# Patient Record
Sex: Female | Born: 1950 | Race: White | Hispanic: No | Marital: Married | Smoking: Former smoker
Health system: Southern US, Community
[De-identification: ages and names within clinical notes are randomized; demographics above are authoritative.]

## PROBLEM LIST (undated history)

## (undated) DIAGNOSIS — T7840XA Allergy, unspecified, initial encounter: Secondary | ICD-10-CM

## (undated) DIAGNOSIS — M1990 Unspecified osteoarthritis, unspecified site: Secondary | ICD-10-CM

## (undated) DIAGNOSIS — K449 Diaphragmatic hernia without obstruction or gangrene: Secondary | ICD-10-CM

## (undated) DIAGNOSIS — I10 Essential (primary) hypertension: Secondary | ICD-10-CM

## (undated) DIAGNOSIS — E785 Hyperlipidemia, unspecified: Secondary | ICD-10-CM

## (undated) DIAGNOSIS — K219 Gastro-esophageal reflux disease without esophagitis: Secondary | ICD-10-CM

## (undated) HISTORY — PX: CHOLECYSTECTOMY: SHX55

## (undated) HISTORY — DX: Gastro-esophageal reflux disease without esophagitis: K21.9

## (undated) HISTORY — DX: Diaphragmatic hernia without obstruction or gangrene: K44.9

## (undated) HISTORY — DX: Allergy, unspecified, initial encounter: T78.40XA

## (undated) HISTORY — DX: Hyperlipidemia, unspecified: E78.5

## (undated) HISTORY — DX: Essential (primary) hypertension: I10

## (undated) HISTORY — DX: Unspecified osteoarthritis, unspecified site: M19.90

## (undated) HISTORY — PX: APPENDECTOMY: SHX54

---
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
## 1978-12-29 HISTORY — PX: KNEE SURGERY: SHX244

## 1982-12-29 HISTORY — PX: ABDOMINAL HYSTERECTOMY: SHX81

## 2007-03-23 ENCOUNTER — Emergency Department (HOSPITAL_COMMUNITY): Admission: EM | Admit: 2007-03-23 | Discharge: 2007-03-23 | Payer: Self-pay | Admitting: Emergency Medicine

## 2007-03-29 ENCOUNTER — Ambulatory Visit: Payer: Self-pay

## 2007-04-01 ENCOUNTER — Ambulatory Visit: Payer: Self-pay | Admitting: Cardiology

## 2007-04-09 ENCOUNTER — Ambulatory Visit: Payer: Self-pay

## 2007-04-19 ENCOUNTER — Ambulatory Visit: Payer: Self-pay | Admitting: General Surgery

## 2008-01-03 ENCOUNTER — Ambulatory Visit: Payer: Self-pay | Admitting: Family Medicine

## 2009-09-03 DIAGNOSIS — K219 Gastro-esophageal reflux disease without esophagitis: Secondary | ICD-10-CM | POA: Insufficient documentation

## 2009-09-03 DIAGNOSIS — E782 Mixed hyperlipidemia: Secondary | ICD-10-CM | POA: Insufficient documentation

## 2011-01-01 ENCOUNTER — Ambulatory Visit: Payer: Self-pay

## 2013-08-31 LAB — CBC
MCH: 29.6 pg (ref 26.0–34.0)
MCHC: 34.8 g/dL (ref 32.0–36.0)
Platelet: 294 10*3/uL (ref 150–440)
RBC: 5.33 10*6/uL — ABNORMAL HIGH (ref 3.80–5.20)
RDW: 13.9 % (ref 11.5–14.5)

## 2013-08-31 LAB — BASIC METABOLIC PANEL
BUN: 10 mg/dL (ref 7–18)
EGFR (African American): >60
Glucose: 90 mg/dL (ref 65–99)

## 2013-08-31 LAB — TROPONIN I: Troponin-I: <0.02 ng/mL

## 2013-09-01 ENCOUNTER — Observation Stay: Payer: Self-pay | Admitting: Internal Medicine

## 2013-09-01 LAB — TROPONIN I
Troponin-I: <0.02 ng/mL
Troponin-I: <0.02 ng/mL

## 2013-09-01 LAB — CK TOTAL AND CKMB (NOT AT ARMC)
CK, Total: 75 U/L (ref 21–215)
CK-MB: 0.5 ng/mL (ref 0.5–3.6)

## 2013-09-02 LAB — CBC WITH DIFFERENTIAL/PLATELET
Basophil #: 0 10*3/uL (ref 0.0–0.1)
Basophil %: 0.1 %
HGB: 15 g/dL (ref 12.0–16.0)
Lymphocyte %: 9.4 %
MCV: 85 fL (ref 80–100)
Monocyte %: 4.2 %
Neutrophil #: 11.6 10*3/uL — ABNORMAL HIGH (ref 1.4–6.5)
Platelet: 283 10*3/uL (ref 150–440)
RDW: 14.2 % (ref 11.5–14.5)

## 2013-09-02 LAB — BASIC METABOLIC PANEL
Anion Gap: 7 (ref 7–16)
Calcium, Total: 9.5 mg/dL (ref 8.5–10.1)
Chloride: 108 mmol/L — ABNORMAL HIGH (ref 98–107)
Creatinine: 0.86 mg/dL (ref 0.60–1.30)
EGFR (African American): >60
EGFR (Non-African Amer.): >60
Glucose: 115 mg/dL — ABNORMAL HIGH (ref 65–99)
Osmolality: 281 (ref 275–301)

## 2013-10-18 ENCOUNTER — Ambulatory Visit: Payer: Self-pay | Admitting: Family Medicine

## 2014-11-17 DIAGNOSIS — I2510 Atherosclerotic heart disease of native coronary artery without angina pectoris: Secondary | ICD-10-CM | POA: Insufficient documentation

## 2014-11-17 DIAGNOSIS — K219 Gastro-esophageal reflux disease without esophagitis: Secondary | ICD-10-CM | POA: Insufficient documentation

## 2014-11-17 DIAGNOSIS — R002 Palpitations: Secondary | ICD-10-CM | POA: Insufficient documentation

## 2015-04-20 NOTE — H&P (Signed)
PATIENT NAME:  Teresa Torres, PINHO MR#:  [id number] DATE OF BIRTH:  July 10, 1951  DATE OF ADMISSION:  09/01/2013  PRIMARY CARE PHYSICIAN:  Dr. Miguel Aschoff.   REFERRING PHYSICIAN:  Dr. Marta Antu.   CHIEF COMPLAINT:  Chest pain.   HISTORY OF PRESENT ILLNESS:  Ms. Hammerstrom is a 64 year old pleasant white female with past medical history of gastroesophageal reflux disease comes to the Emergency Department with complaints of chest pain that started around 4:30 in the morning.  The patient woke up with the chest pain, sharp in nature, radiating to the left jaw.  This was associated with shortness of breath.  The pain has been on and off, lasts from 30 seconds to two minutes.  No relieving factors.  Worsens with exertion.  Concerning this, came to the Emergency Department.  Work-up in the Emergency Department with EKG and cardiac enzymes were unremarkable.  The patient had a stress test done about six years back, left heart cath done 12 years back which was completely normal.  Denies having any nausea, vomiting, lightheadedness, diaphoresis.   PAST MEDICAL HISTORY:  Gastroesophageal reflux disease.   PAST SURGICAL HISTORY:  1.  Hysterectomy.  2.  Right knee surgery.  3.  Cholecystectomy.  4.  Appendectomy.  5.  Tonsillectomy.   SOCIAL HISTORY:  Smoked 1 pack a day, quit about six years back.  Drinks alcohol socially.  Denies using any illicit drugs.  Works as a Art gallery manager.   FAMILY HISTORY:  Heart problems in the family.   REVIEW OF SYSTEMS:  CONSTITUTIONAL:  Denies any generalized weakness.  EYES:  No change in vision.  EARS, NOSE, THROAT:  No change in hearing.  RESPIRATORY:  No cough, shortness of breath.  CARDIOVASCULAR:  Has chest pain.  GASTROINTESTINAL:  No nausea, vomiting, abdominal pain.  GENITOURINARY:  No dysuria or hematuria.  ENDOCRINE:  No polyuria or polydipsia.  HEMATOLOGY:  No easy bruising or bleeding. SKIN:  No rash or lesions.  MUSCULOSKELETAL:  No joint pains   NEUROLOGIC:  No weakness or numbness in any part of the body.   PHYSICAL EXAMINATION: GENERAL:  This is a well-built, well-nourished, age-appropriate female lying down in the bed, not in distress.  VITAL SIGNS:  Temperature 97.6, pulse 65, blood pressure 160/81, respiratory rate of 18, oxygen saturation 99% on room air.  HEENT:  Head normocephalic, atraumatic.  Eyes, no scleral icterus.  Conjunctivae normal.  Pupils equal and react to light.  Mucous membranes moist.  NECK:  Supple.  No lymphadenopathy.  No JVD.  No carotid bruit.  No thyromegaly.  CHEST:  Has no focal tenderness.  LUNGS:  Bilateral clear to auscultation.  HEART:  S1, S2, regular.  No murmurs are heard.  No pedal edema.  Pulses 2+.  ABDOMEN:  Bowel sounds plus.  Soft, nontender, nondistended.  No hepatosplenomegaly.  SKIN:  No rash or lesions.  MUSCULOSKELETAL:  Good range of motion all the extremities.  NEUROLOGIC:  The patient is alert, oriented to place, person and time.  Cranial nerves II through XII intact.  Motor 5 by 5 in upper and lower extremities.   LABORATORY DATA:  CMP is completely within normal limits.  Cardiac enzymes, troponin less than 0.02.  D-dimer is 0.47.  CBC, WBC of 9.6, hemoglobin 15.8, platelet count of 294.  EKG 12-lead, normal sinus rhythm with no ST-T wave abnormalities.    Chest x-ray, no acute cardiopulmonary disease.   ASSESSMENT AND PLAN:  Ms. Lanum is a 64 year old female who  comes to the Emergency Department with complaints of chest pain.  1.  Chest pain.  This seems to be atypical pain, however considering the patient's history of hypertension, age, previous history of smoking and strong family history of coronary artery disease in a young age in her father and a brother in the 41s, admit the patient to the telemetry unit.  Continue to cycle cardiac enzymes x 3.  If negative, we will get a dobutamine nuclear test.  2.  Hypertension, moderately-controlled.  3.  Keep the patient on DVT  prophylaxis with Lovenox.   TIME SPENT:  40 minutes.    ____________________________ Monica Becton, MD pv:ea D: 09/01/2013 04:13:03 ET T: 09/01/2013 04:55:19 ET JOB#: [id number]  cc: Monica Becton, MD, <Dictator> Richard L. Rosanna Randy, MD Grier Mitts Rayli Wiederhold MD ELECTRONICALLY SIGNED 09/04/2013 20:27

## 2015-04-20 NOTE — Consult Note (Signed)
PATIENT NAME:  Teresa Torres, Teresa Torres MR#:  [id number] DATE OF BIRTH:  1951/06/15  DATE OF CONSULTATION:  09/01/2013  REFERRING PHYSICIAN: Dr. Darvin Neighbours   CONSULTING PHYSICIAN:  Corey Skains, MD  REASON FOR CONSULTATION: Unstable angina with hyperlipidemia and history of tobacco abuse.   CHIEF COMPLAINT: "I have chest pain."   HISTORY OF PRESENT ILLNESS: This is a 64 year old female with known significant tobacco use, family history of cardiovascular disease, who has had new onset substernal chest discomfort radiating into her back, left upper chest as well as left neck lasing for several hours, waxing and waxing off and on for the last 24 hours,  now completely relieved, but worsened with activity of going to the bathroom. The patient has weakness and shortness of breath with activity earlier in the week. The patient has had a normal troponin and EKG shows normal sinus rhythm. Additionally, the patient has had a stress test, which shows normal myocardial perfusion with no evidence of ischemia. The symptoms, although appear to be significantly progressive and still occurring with activity to the bathroom and beyond, are consistent with further unstable angina despite no myocardial infarction. She does have hyperlipidemia, not treated with any medications at this time.   REVIEW OF SYSTEMS: The remainder review of systems negative for vision change, ringing in the ears, hearing loss, cough, congestion, heartburn, nausea, vomiting, diarrhea, bloody stools, stomach pain, extremity pain, leg weakness, cramping of the buttocks, known blood clots, headaches, blackouts, dizzy spells, nosebleeds, congestion, trouble swallowing, frequent urination, urination at night, muscle weakness, numbness, anxiety, depression, skin lesions, or skin rashes.   PAST MEDICAL HISTORY: Hyperlipidemia.   FAMILY HISTORY: Multiple family members on the mother's side with cardiovascular disease and early death.   SOCIAL HISTORY: Remote  tobacco use. Denying alcohol and tobacco use at this time.   ALLERGIES: As listed.   MEDICATIONS: As listed.   PHYSICAL EXAMINATION:  VITAL SIGNS: Blood pressure 136/68 bilaterally, heart rate 72 upright, reclining, and regular.  GENERAL: She is a well appearing female in no acute distress.  HEENT: No icterus, thyromegaly, ulcers, hemorrhage, or xanthelasma.  CARDIOVASCULAR: Regular rate and rhythm with normal S1 and S2 without murmur, gallop, or rub. PMI is normal size and placement. Carotid upstroke normal without bruit. Jugular venous pressure is normal.  LUNGS: Lungs are clear to auscultation with normal respirations.  ABDOMEN: Soft, nontender, without hepatosplenomegaly or masses. Abdominal aorta is normal size without bruit.  EXTREMITIES: 2+ bilateral pulses in dorsal, pedal, radial and femoral arteries without lower extremity edema, cyanosis, clubbing, or ulcers.  NEUROLOGIC: She is oriented to time, place, and person with normal mood and affect.   ASSESSMENT: A 64 year old female with hyperlipidemia, family history of cardiovascular disease, remote tobacco use with unstable angina with no current evidence of myocardial infarction, needing further evaluation and treatment options.   RECOMMENDATIONS:  1.  Continue nitroglycerin, aspirin for further treatment of chest discomfort.  2.  Proceed to cardiac catheterization to assess coronary anatomy and further treatment thereof as necessary. The patient understands the risks and benefits of cardiac catheterization. These include the possibility of death, stroke, heart attack, infection, bleeding, or blood clot. She is at low risk for conscious sedation.  ____________________________ Corey Skains, MD bjk:aw D: 09/01/2013 13:11:18 ET T: 09/01/2013 13:34:35 ET JOB#: [id number]  cc: Corey Skains, MD, <Dictator> Corey Skains MD ELECTRONICALLY SIGNED 09/07/2013 14:26

## 2015-04-20 NOTE — Discharge Summary (Signed)
PATIENT NAME:  Teresa Torres, Teresa Torres MR#:  [id number] DATE OF BIRTH:  [birth year]  DATE OF ADMISSION:  09/01/2013 DATE OF DISCHARGE:  09/02/2013  DISCHARGE DIAGNOSIS: Chest pain likely secondary from gastroesophageal reflux disease.   CONSULTANTS: Dr. Nehemiah Massed.   PROCEDURES: Cardiac catheterization which showed 1 vessel disease with no stent placed.   ADMITTING HISTORY AND PHYSICAL: Please see detailed H and P dictated by Dr. Lunette Stands previously. In brief, this is a 64 year old female patient who presented to the hospital complaining of chest pain, admitted to the hospitalist service.   HOSPITAL COURSE: For the chest pain, the patient had cardiac enzymes done which were normal. Also had a stress test which did not show any reversible ischemia, but secondary to her atypical symptoms with exertional chest pain the patient had a cardiac cath done which showed 1 vessel disease which did not need any stent. The patient also had a CT scan of the chest for pulmonary embolism which showed no pulmonary edema, infiltrate or PE. The patient is being discharged home to follow up with Dr. Nehemiah Massed of cardiology for her chest pain. Also, her PPI has been increased from once a day to twice a day to see if she responds to this change for her chest pain.   Prior to discharge, the patient did not have any chest pain or chest tenderness on exam. S1 and S2 without any murmurs on auscultation.   DISCHARGE MEDICATIONS: Include:  1.  Dexilant 60 mg oral 2 times a day.  2.  Alfalfa 1 tablet oral 1 to 2 times a day for allergies.  3.  Lipitor 10 mg oral once a day.  4.  Aspirin 81 mg oral once a day.   DISCHARGE INSTRUCTIONS: Low-fat, low-cholesterol diet. Activity as tolerated. Follow up with primary care physician and Dr. Nehemiah Massed in 1 to 2 weeks.   TIME SPENT: On day of discharge in discharge activity was 45 minutes.   ____________________________ Leia Alf Batina Dougan, MD srs:sb D: 09/06/2013 14:34:42 ET T: 09/06/2013  14:41:21 ET JOB#: [id number]  cc: Alveta Heimlich R. Jaicey Sweaney, MD, <Dictator> Neita Carp MD ELECTRONICALLY SIGNED 09/10/2013 19:28

## 2015-07-30 DIAGNOSIS — I10 Essential (primary) hypertension: Secondary | ICD-10-CM | POA: Insufficient documentation

## 2015-08-13 ENCOUNTER — Ambulatory Visit
Admission: RE | Admit: 2015-08-13 | Discharge: 2015-08-13 | Disposition: A | Discharge status: Home / Self Care | Payer: BLUE CROSS/BLUE SHIELD | Source: Ambulatory Visit | Attending: Family Medicine | Admitting: Family Medicine

## 2015-08-13 ENCOUNTER — Encounter: Payer: Self-pay | Admitting: Family Medicine

## 2015-08-13 ENCOUNTER — Ambulatory Visit (INDEPENDENT_AMBULATORY_CARE_PROVIDER_SITE_OTHER): Payer: BLUE CROSS/BLUE SHIELD | Admitting: Family Medicine

## 2015-08-13 VITALS — BP 120/80 | HR 72 | Temp 98.1°F | Resp 16 | Wt 175.0 lb

## 2015-08-13 DIAGNOSIS — M25561 Pain in right knee: Secondary | ICD-10-CM | POA: Diagnosis not present

## 2015-08-13 DIAGNOSIS — M179 Osteoarthritis of knee, unspecified: Secondary | ICD-10-CM | POA: Insufficient documentation

## 2015-08-13 MED ORDER — NAPROXEN 500 MG PO TBEC: 500.0000 mg | DELAYED_RELEASE_TABLET | Freq: Two times a day (BID) | ORAL | Status: DC

## 2015-08-13 NOTE — Progress Notes (Signed)
Patient: Teresa Torres Female    DOB: 19-Jul-1951   64 y.o.   MRN: [id number] Visit Date: 08/13/2015  Today's Provider: Vernie Murders, PA   Chief Complaint  Patient presents with  . Knee Pain    Right knee   Subjective:    Knee Pain  Incident onset: It has been bothering her for about a year. Last month has gotten worst. There was no injury mechanism. The pain is present in the right knee. The quality of the pain is described as burning and aching (burns inside like pinching pain and it aches at night). The pain is at a severity of 3/10 (when tries to walk is an 8). The pain is moderate. Pain course: Depends on what patient is doing. Associated symptoms include a loss of motion and tingling (more burning). Pertinent negatives include no inability to bear weight, loss of sensation, muscle weakness or numbness. The symptoms are aggravated by movement. Treatments tried: Aleve. The treatment provided no relief.   Past Surgical History  Procedure Laterality Date  . Tonsillectomy  1961  . Breast biopsy  1972  . Cholecystectomy  2007-2008  . Knee surgery  1980  . Abdominal hysterectomy  1984    Partial   History reviewed. No pertinent family history. Past Medical History  Diagnosis Date  . Hyperlipidemia   . Hiatal hernia   . Arthritis     of spine     Allergies  Allergen Reactions  . Compazine [Prochlorperazine Edisylate] Anaphylaxis  . Aspirin Nausea And Vomiting   Previous Medications   ATORVASTATIN (LIPITOR) 10 MG TABLET    Take by mouth.   DEXLANSOPRAZOLE (DEXILANT) 60 MG CAPSULE    Take by mouth.   Review of Systems  Constitutional: Negative.   HENT: Negative.   Eyes: Negative.   Respiratory: Negative.   Cardiovascular: Negative.  Negative for chest pain and palpitations.  Gastrointestinal: Negative.   Endocrine: Negative.   Genitourinary: Negative.   Musculoskeletal: Negative.        Knee is very Stiff and tight  Skin: Negative.   Allergic/Immunologic:  Negative.   Neurological: Positive for tingling (more burning). Negative for weakness and numbness.  Hematological: Negative.   Psychiatric/Behavioral: Negative.    Social History  Substance Use Topics  . Smoking status: Former Research scientist (life sciences)  . Smokeless tobacco: Never Used     Comment: smoked cigarettes about 1 pack per day; quit in 2007, smoked fro 20 years  . Alcohol Use: Yes     Comment: Drinks wine; Occasional alcohol use   Objective:   BP 120/80 mmHg  Pulse 72  Temp(Src) 98.1 F (36.7 C) (Oral)  Resp 16  Wt 175 lb (79.379 kg)  Physical Exam  Constitutional: She is oriented to person, place, and time. She appears well-developed and well-nourished. No distress.  HENT:  Head: Normocephalic and atraumatic.  Right Ear: Hearing normal.  Left Ear: Hearing normal.  Nose: Nose normal.  Eyes: Conjunctivae and lids are normal. Right eye exhibits no discharge. Left eye exhibits no discharge. No scleral icterus.  Pulmonary/Chest: Effort normal. No respiratory distress.  Musculoskeletal:  Tender medial side of right knee with generalized swelling. About 10-15 degrees of extension loss. Increased pain when she first stands. Better after walking a few steps.   Neurological: She is alert and oriented to person, place, and time.  Skin: Skin is intact. No lesion and no rash noted.  Psychiatric: She has a normal mood and affect. Her speech  is normal and behavior is normal. Thought content normal.      Assessment & Plan:     1. Right knee pain Worsening of right knee pain over the past month without known injury. History of surgery for subluxing patella in 1980. Swelling and medial tenderness with decrease in full extension of the right knee. Will get x-ray evaluation and treat with EC-Naprosyn BID. Recheck pending reports. - DG Knee Complete 4 Views Right - naproxen (EC NAPROSYN) 500 MG EC tablet; Take 1 tablet (500 mg total) by mouth 2 (two) times daily with a meal.  Dispense: 60 tablet;  Refill: 2       Vernie Murders, PA  [facility]

## 2015-08-14 ENCOUNTER — Telehealth: Payer: Self-pay

## 2015-08-14 NOTE — Telephone Encounter (Signed)
Patient advised as directed below.  Thanks,  -Joseline 

## 2015-08-14 NOTE — Telephone Encounter (Signed)
-----   Message from Margo Common, [place] sent at 08/13/2015  6:41 PM EDT ----- X-ray confirms osteoarthritis of the knee. If no better with use of the EC-Naprosyn and rest for 7-10 days, will need referral to orthopedist to consider cortisone injection.

## 2015-10-19 ENCOUNTER — Other Ambulatory Visit: Payer: Self-pay | Admitting: Family Medicine

## 2015-10-19 ENCOUNTER — Ambulatory Visit (INDEPENDENT_AMBULATORY_CARE_PROVIDER_SITE_OTHER): Payer: BLUE CROSS/BLUE SHIELD | Admitting: Family Medicine

## 2015-10-19 ENCOUNTER — Encounter: Payer: Self-pay | Admitting: Family Medicine

## 2015-10-19 VITALS — BP 126/72 | HR 64 | Temp 98.3°F | Resp 16 | Wt 179.4 lb

## 2015-10-19 DIAGNOSIS — N3091 Cystitis, unspecified with hematuria: Secondary | ICD-10-CM

## 2015-10-19 LAB — POCT URINALYSIS DIPSTICK
Bilirubin, UA: NEGATIVE
Glucose, UA: NEGATIVE
Ketones, UA: NEGATIVE
Nitrite, UA: NEGATIVE
PROTEIN UA: NEGATIVE
Spec Grav, UA: <=1.005
UROBILINOGEN UA: 0.2
pH, UA: 5

## 2015-10-19 MED ORDER — NITROFURANTOIN MONOHYD MACRO 100 MG PO CAPS: 100.0000 mg | ORAL_CAPSULE | Freq: Two times a day (BID) | ORAL | Status: DC

## 2015-10-19 NOTE — Progress Notes (Signed)
Subjective:     Patient ID: Teresa Torres, female   DOB: January 11, 1951, 64 y.o.   MRN: [id number]  HPI  Chief Complaint  Patient presents with  . Urinary Frequency    Patient comes in office today with concerns or urinary frequency an pelvic pressure since this morning  States she get a bladder infection annually. No hx of kidney stones.   Review of Systems  Constitutional: Negative for fever and chills.       Objective:   Physical Exam  Constitutional: She appears well-developed and well-nourished. No distress.  Genitourinary:  No cva tenderness       Assessment:    1. Cystitis with hematuria - POCT urinalysis dipstick - Urine culture - nitrofurantoin, macrocrystal-monohydrate, (MACROBID) 100 MG capsule; Take 1 capsule (100 mg total) by mouth 2 (two) times daily.  Dispense: 14 capsule; Refill: 0    Plan:    Further f/u pending urine culture.

## 2015-10-19 NOTE — Patient Instructions (Signed)
We will call you with the urine culture result. 

## 2015-10-22 LAB — URINE CULTURE

## 2015-10-30 DIAGNOSIS — R0681 Apnea, not elsewhere classified: Secondary | ICD-10-CM | POA: Insufficient documentation

## 2016-01-21 ENCOUNTER — Encounter: Payer: Self-pay | Admitting: Family Medicine

## 2016-01-21 ENCOUNTER — Ambulatory Visit (INDEPENDENT_AMBULATORY_CARE_PROVIDER_SITE_OTHER): Payer: BLUE CROSS/BLUE SHIELD | Admitting: Family Medicine

## 2016-01-21 VITALS — BP 124/74 | HR 64 | Temp 98.3°F | Resp 16 | Wt 173.8 lb

## 2016-01-21 DIAGNOSIS — M47819 Spondylosis without myelopathy or radiculopathy, site unspecified: Secondary | ICD-10-CM | POA: Insufficient documentation

## 2016-01-21 DIAGNOSIS — H9201 Otalgia, right ear: Secondary | ICD-10-CM

## 2016-01-21 NOTE — Patient Instructions (Signed)
Start Naproxen twice daily with food.

## 2016-01-21 NOTE — Progress Notes (Signed)
Subjective:     Patient ID: Teresa Torres, female   DOB: 1951-11-04, 65 y.o.   MRN: [id number]  HPI  Chief Complaint  Patient presents with  . Ear Pain    Patient comes in office today with complaints of right ear pain intermittent for more than 1 week. Patient reports on Saturday pain increased she describes it as a sharp/achy pain. Patient has tried taking otc Advil and Zyrtec with no relief.  States pain has abated but feels she is "hearing through a tunnel." Denies precedent cold or significant allergy symptoms. No hx of bruxism or gum chewing. States she saw her dentist one month ago with normal exam. Has hx of neck arthritis per a neurologist 20 years ago-no imaging.   Review of Systems     Objective:   Physical Exam  Constitutional: She appears well-developed and well-nourished. No distress.  HENT:  No tenderness on percussion of her right posterior teeth. No TMJ tenderness. Left TM is normal; R TM partially obscured by cerumen but no inflammation noted or tragal tenderness. No rash or facial weakness noted.  Musculoskeletal:  Cervical ROM is normal.       Assessment:    1. Otalgia of right ear: ? Eustachian tube dysfunction ? Cervical arthritis    Plan:    Start Naproxen; Consider fluticasone if not improving.

## 2016-02-15 ENCOUNTER — Ambulatory Visit (INDEPENDENT_AMBULATORY_CARE_PROVIDER_SITE_OTHER): Payer: BLUE CROSS/BLUE SHIELD | Admitting: Family Medicine

## 2016-02-15 ENCOUNTER — Encounter: Payer: Self-pay | Admitting: Family Medicine

## 2016-02-15 VITALS — BP 130/92 | HR 76 | Temp 98.0°F | Resp 16 | Ht 63.25 in | Wt 174.4 lb

## 2016-02-15 DIAGNOSIS — I10 Essential (primary) hypertension: Secondary | ICD-10-CM

## 2016-02-15 DIAGNOSIS — E782 Mixed hyperlipidemia: Secondary | ICD-10-CM | POA: Diagnosis not present

## 2016-02-15 DIAGNOSIS — Z0000 Encounter for general adult medical examination without abnormal findings: Secondary | ICD-10-CM

## 2016-02-15 DIAGNOSIS — K219 Gastro-esophageal reflux disease without esophagitis: Secondary | ICD-10-CM

## 2016-02-15 DIAGNOSIS — Z124 Encounter for screening for malignant neoplasm of cervix: Secondary | ICD-10-CM

## 2016-02-15 LAB — POCT URINALYSIS DIPSTICK
Bilirubin, UA: NEGATIVE
GLUCOSE UA: NEGATIVE
Ketones, UA: NEGATIVE
Leukocytes, UA: NEGATIVE
Nitrite, UA: NEGATIVE
PH UA: 6
Protein, UA: NEGATIVE
RBC UA: NEGATIVE
UROBILINOGEN UA: 0.2

## 2016-02-15 NOTE — Progress Notes (Signed)
Patient ID: Teresa Torres, female   DOB: [birth year], 65 y.o.   MRN: [id number]       Patient: Teresa Torres, Female    DOB: 1951-07-16, 65 y.o.   MRN: [id number] Visit Date: 02/15/2016  Today's Provider: Vernie Murders, PA   Chief Complaint  Patient presents with  . Annual Exam   Subjective:    Annual physical exam Teresa Torres is a 65 y.o. female who presents today for health maintenance and complete physical. She feels well. She reports exercising 2 times per week. She reports she is sleeping well (average 6-8 hours per night).  -----------------------------------------------------------------   Review of Systems  Constitutional: Negative.   HENT: Positive for ear pain and sneezing.   Eyes: Positive for itching.  Respiratory: Negative.   Cardiovascular: Negative.   Gastrointestinal: Negative.   Endocrine: Negative.   Genitourinary: Negative.   Musculoskeletal: Positive for arthralgias.  Skin: Negative.   Allergic/Immunologic: Positive for food allergies.  Neurological: Negative.   Hematological: Negative.   Psychiatric/Behavioral: Negative.     Social History      She  reports that she has quit smoking. She has never used smokeless tobacco. She reports that she drinks alcohol. She reports that she does not use illicit drugs.       Social History   Social History  . Marital Status: Married    Spouse Name: N/A  . Number of Children: N/A  . Years of Education: N/A   Social History Main Topics  . Smoking status: Former Research scientist (life sciences)  . Smokeless tobacco: Never Used     Comment: smoked cigarettes about 1 pack per day; quit in 2007, smoked fro 20 years  . Alcohol Use: Yes     Comment: Drinks wine; Occasional alcohol use  . Drug Use: No  . Sexual Activity: Not Asked   Other Topics Concern  . None   Social History Narrative    Past Medical History  Diagnosis Date  . Hyperlipidemia   . Hiatal hernia   . Arthritis     of spine     Patient Active Problem List   Diagnosis Date Noted  . Arthritis of spine 01/21/2016  . Breathlessness on exertion 10/30/2015  . Benign essential HTN 07/30/2015  . Arteriosclerosis of coronary artery 11/17/2014  . Acid reflux 11/17/2014  . Awareness of heartbeats 11/17/2014  . HYPERLIPIDEMIA, MIXED 09/03/2009  . ACID REFLUX DISEASE 09/03/2009    Past Surgical History  Procedure Laterality Date  . Tonsillectomy  1961  . Breast biopsy  1972  . Cholecystectomy  2007-2008  . Knee surgery  1980  . Abdominal hysterectomy  1984    Partial    Family History        Family Status  Relation Status Death Age  . Mother Deceased 29    Heart Disease  . Father Deceased 83    Died from a motor vehicle  . Brother Deceased     died from a motor vehicle accident  . Son Alive     strokes      Allergies  Allergen Reactions  . Compazine [Prochlorperazine Edisylate] Anaphylaxis  . Aspirin Nausea And Vomiting    Previous Medications   ATORVASTATIN (LIPITOR) 10 MG TABLET    Take by mouth.   DEXLANSOPRAZOLE (DEXILANT) 60 MG CAPSULE    Take by mouth.   NAPROXEN (EC NAPROSYN) 500 MG EC TABLET    Take 1 tablet (500 mg total) by mouth 2 (two) times daily with a  meal.    Patient Care Team: Margo Common, PA as PCP - General (Physician Assistant)     Objective:   Vitals: BP 130/92 mmHg  Pulse 76  Temp(Src) 98 F (36.7 C) (Oral)  Resp 16  Ht 5' 3.25" (1.607 m)  Wt 174 lb 6.4 oz (79.107 kg)  BMI 30.63 kg/m2  BP Readings from Last 3 Encounters:  02/15/16 130/92  01/21/16 124/74  02/26/15 140/76    Physical Exam  Constitutional: She is oriented to person, place, and time. She appears well-developed and well-nourished.  HENT:  Head: Normocephalic.  Right Ear: External ear normal.  Left Ear: External ear normal.  Nose: Nose normal.  Mouth/Throat: Oropharynx is clear and moist.  Eyes: Conjunctivae and EOM are normal. Pupils are equal, round, and reactive to light.  Neck: Normal range of motion. Neck supple.  No JVD present. No thyromegaly present.  Cardiovascular: Normal rate, regular rhythm, normal heart sounds and intact distal pulses.   Pulmonary/Chest: Effort normal and breath sounds normal.  Abdominal: Soft. Bowel sounds are normal.  Genitourinary: No vaginal discharge found.  Status post partial hysterectomy in 1984 due to DUB from endometrial polyps. No breast masses on palpation. 2-3+ cystocele noted on bimanual exam.  Musculoskeletal:  Fair ROM in spine. Right knee puffy and approximate 10 degree loss of full extension with some crepitus. Good 2+ pulses bilaterally in all extremities.  Lymphadenopathy:    She has no cervical adenopathy.  Neurological: She is alert and oriented to person, place, and time. She has normal reflexes.  Skin: No rash noted.  Psychiatric: She has a normal mood and affect. Her behavior is normal. Thought content normal.    Depression Screen PHQ 2/9 Scores 02/15/2016  PHQ - 2 Score 0    Assessment & Plan:     Routine Health Maintenance and Physical Exam  Exercise Activities and Dietary recommendations Goals    Recommend continuing regular exercise 2-3 days a week. Walk a half-mile twice a week. Continue low fat diet.      Immunization History  Administered Date(s) Administered  . Tdap 10/17/2013    Health Maintenance  Topic Date Due  . Hepatitis C Screening  11/28/1951  . HIV Screening  08/29/1968  . PAP SMEAR  08/29/1974  . ZOSTAVAX  08/29/2013  . INFLUENZA VACCINE  07/30/2015  . MAMMOGRAM  10/19/2015  . TETANUS/TDAP  10/18/2023  . COLONOSCOPY  12/06/2023      Discussed health benefits of physical activity, and encouraged her to engage in regular exercise appropriate for her age and condition.    -------------------------------------------------------------------- 1. Annual physical exam General exam normal. Will schedule screening mammogram and BMD tests. Tdap up to date and last colonoscopy in 2014. Declines Zostavax until she  returns from trip to [place] next month. Obtained specimen for PAP. - POCT urinalysis dipstick - DG Bone Density - MM Digital Screening  2. Gastroesophageal reflux disease, esophagitis presence not specified Well controlled with use of Dexilant. Continues follow up with gastroenterologist.  3. Benign essential HTN Borderline hypertension with diastolic pressure 92. Limit salt and caffeine in diet. Decrease use of Naproxen pending lab reports. - CBC with Differential/Platelet; Future  4. HYPERLIPIDEMIA, MIXED Tolerating Atorvastatin 10 mg qd and low fat diet. Should lose some weight and continue regular exercise. Recheck labs. - Lipid panel; Future - TSH; Future - Comprehensive Metabolic Panel (CMET); Future  5. Cervical cancer screening History of partial hysterectomy in 1984 for DUB with endometrial polyps (benign). No adnexal masses  with cystocele on pelvic exam. Obtained specimen for PAP. - Pap IG (Image Guided)

## 2016-02-18 LAB — PAP IG (IMAGE GUIDED): PAP Smear Comment: 0

## 2016-02-19 ENCOUNTER — Telehealth: Payer: Self-pay

## 2016-02-19 NOTE — Telephone Encounter (Signed)
-----   Message from Margo Common, [place] sent at 02/18/2016  6:17 PM EST ----- Normal PAP without any signs of cancerous cells.

## 2016-02-19 NOTE — Telephone Encounter (Signed)
LMTCB

## 2016-02-20 NOTE — Telephone Encounter (Signed)
Patient advised.

## 2016-02-22 ENCOUNTER — Telehealth: Payer: Self-pay | Admitting: Family Medicine

## 2016-02-22 MED ORDER — ATORVASTATIN CALCIUM 10 MG PO TABS: 10.0000 mg | ORAL_TABLET | Freq: Every day | ORAL | Status: DC

## 2016-02-22 NOTE — Telephone Encounter (Signed)
Pt is requesting refill on atorvastatin (LIPITOR) 10 MG tablet. Sent into [facility]

## 2016-02-22 NOTE — Telephone Encounter (Signed)
Sent refill of Atorvastatin but still need to get labs done. Future order is in chart.

## 2016-02-22 NOTE — Telephone Encounter (Signed)
Please review-aa 

## 2016-02-25 NOTE — Telephone Encounter (Signed)
LMTCB

## 2016-02-26 ENCOUNTER — Other Ambulatory Visit: Payer: Self-pay

## 2016-02-26 DIAGNOSIS — E782 Mixed hyperlipidemia: Secondary | ICD-10-CM

## 2016-02-26 DIAGNOSIS — I10 Essential (primary) hypertension: Secondary | ICD-10-CM

## 2016-02-26 NOTE — Telephone Encounter (Signed)
Opened in error

## 2016-02-26 NOTE — Telephone Encounter (Signed)
Pt returned call. I advised pt of info below. Pt stated she plans to get her labs done this week. Thanks TNP

## 2016-02-28 ENCOUNTER — Telehealth: Payer: Self-pay

## 2016-02-28 LAB — LIPID PANEL
CHOL/HDL RATIO: 4 ratio (ref 0.0–4.4)
CHOLESTEROL TOTAL: 232 mg/dL — AB (ref 100–199)
HDL: 58 mg/dL (ref >39)
LDL Calculated: 141 mg/dL — ABNORMAL HIGH (ref 0–99)
Triglycerides: 166 mg/dL — ABNORMAL HIGH (ref 0–149)
VLDL Cholesterol Cal: 33 mg/dL (ref 5–40)

## 2016-02-28 LAB — CBC WITH DIFFERENTIAL/PLATELET
BASOS: 1 %
Basophils Absolute: 0 10*3/uL (ref 0.0–0.2)
EOS (ABSOLUTE): 0.2 10*3/uL (ref 0.0–0.4)
Eos: 2 %
HEMOGLOBIN: 14.1 g/dL (ref 11.1–15.9)
Hematocrit: 42.2 % (ref 34.0–46.6)
IMMATURE GRANS (ABS): 0 10*3/uL (ref 0.0–0.1)
Immature Granulocytes: 0 %
LYMPHS: 37 %
Lymphocytes Absolute: 2.5 10*3/uL (ref 0.7–3.1)
MCH: 29.1 pg (ref 26.6–33.0)
MCHC: 33.4 g/dL (ref 31.5–35.7)
MCV: 87 fL (ref 79–97)
MONOCYTES: 7 %
Monocytes Absolute: 0.5 10*3/uL (ref 0.1–0.9)
NEUTROS ABS: 3.6 10*3/uL (ref 1.4–7.0)
Neutrophils: 53 %
Platelets: 331 10*3/uL (ref 150–379)
RBC: 4.84 x10E6/uL (ref 3.77–5.28)
RDW: 13.3 % (ref 12.3–15.4)
WBC: 6.8 10*3/uL (ref 3.4–10.8)

## 2016-02-28 LAB — COMPREHENSIVE METABOLIC PANEL
A/G RATIO: 2.2 (ref 1.1–2.5)
ALBUMIN: 4.3 g/dL (ref 3.6–4.8)
ALT: 18 IU/L (ref 0–32)
AST: 16 IU/L (ref 0–40)
Alkaline Phosphatase: 62 IU/L (ref 39–117)
BILIRUBIN TOTAL: 0.3 mg/dL (ref 0.0–1.2)
BUN / CREAT RATIO: 17 (ref 11–26)
BUN: 16 mg/dL (ref 8–27)
CHLORIDE: 104 mmol/L (ref 96–106)
CO2: 26 mmol/L (ref 18–29)
Calcium: 9.9 mg/dL (ref 8.7–10.3)
Creatinine, Ser: 0.93 mg/dL (ref 0.57–1.00)
GFR calc non Af Amer: 66 mL/min/{1.73_m2} (ref >59)
GFR, EST AFRICAN AMERICAN: 76 mL/min/{1.73_m2} (ref >59)
Globulin, Total: 2 g/dL (ref 1.5–4.5)
Glucose: 97 mg/dL (ref 65–99)
POTASSIUM: 5.2 mmol/L (ref 3.5–5.2)
Sodium: 145 mmol/L — ABNORMAL HIGH (ref 134–144)
TOTAL PROTEIN: 6.3 g/dL (ref 6.0–8.5)

## 2016-02-28 LAB — TSH: TSH: 3.66 u[IU]/mL (ref 0.450–4.500)

## 2016-02-28 NOTE — Telephone Encounter (Signed)
Left message to call back  

## 2016-02-28 NOTE — Telephone Encounter (Signed)
-----   Message from Margo Common, [place] sent at 02/28/2016  8:47 AM EST ----- Total cholesterol, triglycerides and LDL still high. If she has been on the 10 mg qd of Atrovastatin, will need to increase to 20 mg qd. If not, need to get on regular dosing daily and get back on the low fat diet. Recheck progress in 3 months.

## 2016-03-03 ENCOUNTER — Ambulatory Visit
Admission: RE | Admit: 2016-03-03 | Discharge: 2016-03-03 | Disposition: A | Discharge status: Home / Self Care | Payer: BLUE CROSS/BLUE SHIELD | Source: Ambulatory Visit | Attending: Family Medicine | Admitting: Family Medicine

## 2016-03-03 DIAGNOSIS — Z1231 Encounter for screening mammogram for malignant neoplasm of breast: Secondary | ICD-10-CM | POA: Diagnosis not present

## 2016-03-03 DIAGNOSIS — M8580 Other specified disorders of bone density and structure, unspecified site: Secondary | ICD-10-CM | POA: Insufficient documentation

## 2016-03-03 DIAGNOSIS — Z13820 Encounter for screening for osteoporosis: Secondary | ICD-10-CM | POA: Insufficient documentation

## 2016-03-04 ENCOUNTER — Other Ambulatory Visit: Payer: Self-pay | Admitting: Family Medicine

## 2016-03-04 DIAGNOSIS — R928 Other abnormal and inconclusive findings on diagnostic imaging of breast: Secondary | ICD-10-CM

## 2016-03-04 NOTE — Telephone Encounter (Signed)
-----   Message from Margo Common, [place] sent at 03/03/2016  4:57 PM EST ----- Minimal fracture risk on bone density test. Recommend Calcium 1200-1800 mg and 1000 IU  Vitamin-D daily to prevent decrease in bone density. Recheck test in 2 years.

## 2016-03-04 NOTE — Telephone Encounter (Signed)
-----   Message from Margo Common, [place] sent at 03/04/2016 11:10 AM EST ----- Possible lesion in the left breast. Proceed with additional diagnostic mammogram and ultrasound as recommended by radiologist.

## 2016-03-04 NOTE — Telephone Encounter (Signed)
Left message to call back  

## 2016-03-13 ENCOUNTER — Other Ambulatory Visit: Payer: Self-pay

## 2016-03-13 DIAGNOSIS — R928 Other abnormal and inconclusive findings on diagnostic imaging of breast: Secondary | ICD-10-CM

## 2016-03-13 NOTE — Telephone Encounter (Signed)
LMTCB

## 2016-03-14 NOTE — Telephone Encounter (Signed)
LMTCB

## 2016-03-17 NOTE — Telephone Encounter (Signed)
Patient advised as directed below. Patient verbalized understanding and agrees with plan of care. Patient states she had not been taking Atorvastatin 10 mg for a couple months prior to lab work. Patient states she will take medication on a daily basis.

## 2016-03-21 ENCOUNTER — Telehealth: Payer: Self-pay | Admitting: Family Medicine

## 2016-03-21 NOTE — Telephone Encounter (Signed)
Ask patient if extra images on left breast lesion found on mammograms on 03-04-16 have been done? No reports in chart yet.

## 2016-03-24 ENCOUNTER — Telehealth: Payer: Self-pay

## 2016-03-24 ENCOUNTER — Ambulatory Visit
Admission: RE | Admit: 2016-03-24 | Discharge: 2016-03-24 | Disposition: A | Discharge status: Home / Self Care | Payer: BLUE CROSS/BLUE SHIELD | Source: Ambulatory Visit | Attending: Family Medicine | Admitting: Family Medicine

## 2016-03-24 DIAGNOSIS — R928 Other abnormal and inconclusive findings on diagnostic imaging of breast: Secondary | ICD-10-CM | POA: Insufficient documentation

## 2016-03-24 NOTE — Telephone Encounter (Signed)
Patient is getting extra images on breast today.

## 2016-03-24 NOTE — Telephone Encounter (Signed)
LMTCB

## 2016-03-24 NOTE — Telephone Encounter (Signed)
-----   Message from Margo Common, [place] sent at 03/24/2016  3:42 PM EDT ----- Mammogram and ultrasound of left breast mass is negative for malignancy. Probably a benign lesion. Radiologist recommended repeat films in 6 months to be certain this lesion does not change or become suspicious for cancer.

## 2016-04-01 NOTE — Telephone Encounter (Signed)
LMTCB

## 2016-04-02 NOTE — Telephone Encounter (Signed)
Patient advised as directed below. Patient states she had already received the report from other provider.

## 2016-04-02 NOTE — Telephone Encounter (Signed)
LMTCB

## 2016-04-02 NOTE — Telephone Encounter (Signed)
Pt is returning call.  CB#[phone] after 1:30/MW

## 2016-09-23 ENCOUNTER — Ambulatory Visit (INDEPENDENT_AMBULATORY_CARE_PROVIDER_SITE_OTHER): Payer: BLUE CROSS/BLUE SHIELD | Admitting: Family Medicine

## 2016-09-23 ENCOUNTER — Encounter: Payer: Self-pay | Admitting: Family Medicine

## 2016-09-23 VITALS — BP 136/88 | HR 82 | Temp 97.9°F | Resp 16 | Wt 183.2 lb

## 2016-09-23 DIAGNOSIS — J320 Chronic maxillary sinusitis: Secondary | ICD-10-CM | POA: Diagnosis not present

## 2016-09-23 MED ORDER — AMOXICILLIN-POT CLAVULANATE 875-125 MG PO TABS: 1.0000 | ORAL_TABLET | Freq: Two times a day (BID) | ORAL | 0 refills | Status: DC

## 2016-09-23 NOTE — Progress Notes (Signed)
Patient: Teresa Torres Female    DOB: 1951/07/12   65 y.o.   MRN: [id number] Visit Date: 09/23/2016  Today's Provider: Vernie Murders, PA   Chief Complaint  Patient presents with  . URI   Subjective:    URI   This is a new problem. The current episode started more than 1 month ago. The problem has been waxing and waning. There has been no fever. Associated symptoms include congestion, coughing, ear pain, headaches, rhinorrhea and sinus pain. She has tried antihistamine and decongestant for the symptoms. The treatment provided mild relief.  No significant relief from allergy medications, steroid nasal spray, herbal medications, etc. Xyzal causes significant drowsiness day and night when taken.  Past Medical History:  Diagnosis Date  . Arthritis    of spine  . Hiatal hernia   . Hyperlipidemia    Past Surgical History:  Procedure Laterality Date  . ABDOMINAL HYSTERECTOMY  1984   Partial  . BREAST BIOPSY Right 1972   EXCISIONAL - NEG  . CHOLECYSTECTOMY  2007-2008  . KNEE SURGERY  1980  . TONSILLECTOMY  1961   Family History  Problem Relation Age of Onset  . Breast cancer Paternal Grandmother    Allergies  Allergen Reactions  . Compazine [Prochlorperazine Edisylate] Anaphylaxis  . Aspirin Nausea And Vomiting     Previous Medications   ATORVASTATIN (LIPITOR) 10 MG TABLET    Take 1 tablet (10 mg total) by mouth daily.   DEXLANSOPRAZOLE (DEXILANT) 60 MG CAPSULE    Take by mouth.   NAPROXEN (EC NAPROSYN) 500 MG EC TABLET    Take 1 tablet (500 mg total) by mouth 2 (two) times daily with a meal.    Review of Systems  Constitutional: Negative.   HENT: Positive for congestion, ear pain and rhinorrhea.   Respiratory: Positive for cough.   Cardiovascular: Negative.   Neurological: Positive for headaches.    Social History  Substance Use Topics  . Smoking status: Former Research scientist (life sciences)  . Smokeless tobacco: Never Used     Comment: smoked cigarettes about 1 pack per day; quit in  2007, smoked fro 20 years  . Alcohol use Yes     Comment: Drinks wine; Occasional alcohol use   Objective:   BP 136/88 (BP Location: Right Arm, Patient Position: Sitting, Cuff Size: Normal)   Pulse 82   Temp 97.9 F (36.6 C) (Oral)   Resp 16   Wt 183 lb 3.2 oz (83.1 kg)   BMI 32.20 kg/m   Physical Exam  Constitutional: She is oriented to person, place, and time. She appears well-developed and well-nourished. No distress.  HENT:  Head: Normocephalic and atraumatic.  Right Ear: Hearing and external ear normal.  Left Ear: Hearing and external ear normal.  Nose: Nose normal.  Mouth/Throat: Oropharynx is clear and moist.  No transillumination of the right maxillary sinus and poor transillumination on the left. Normal in frontal sinuses.  Eyes: Conjunctivae and lids are normal. Right eye exhibits no discharge. Left eye exhibits no discharge. No scleral icterus.  Neck: Neck supple.  Cardiovascular: Normal rate and regular rhythm.   Pulmonary/Chest: Effort normal and breath sounds normal. No respiratory distress.  Abdominal: Soft. Bowel sounds are normal.  Musculoskeletal: Normal range of motion.  Neurological: She is alert and oriented to person, place, and time.  Skin: Skin is intact. No lesion and no rash noted.  Psychiatric: She has a normal mood and affect. Her speech is normal and behavior is normal. Thought content  normal.      Assessment & Plan:     1. Maxillary sinusitis, unspecified chronicity Recurrent several times in the past 4 months with congestion and sinus pressure. Some sneezing and rhinorrhea. Occasionally has PND with scratchy sore throat. Will treat with Augmentin, Mucinex, antihistamine and nasal steroid spray. If no better in 10-14 days, will need CT scan of sinuses and possible ENT referral. - amoxicillin-clavulanate (AUGMENTIN) 875-125 MG tablet; Take 1 tablet by mouth 2 (two) times daily.  Dispense: 20 tablet; Refill: 0

## 2016-10-30 ENCOUNTER — Encounter: Payer: Self-pay | Admitting: Family Medicine

## 2016-10-30 ENCOUNTER — Ambulatory Visit (INDEPENDENT_AMBULATORY_CARE_PROVIDER_SITE_OTHER): Payer: BLUE CROSS/BLUE SHIELD | Admitting: Family Medicine

## 2016-10-30 ENCOUNTER — Other Ambulatory Visit: Payer: Self-pay

## 2016-10-30 VITALS — BP 158/88 | HR 75 | Temp 97.6°F | Resp 14 | Wt 183.4 lb

## 2016-10-30 DIAGNOSIS — Z23 Encounter for immunization: Secondary | ICD-10-CM

## 2016-10-30 DIAGNOSIS — R300 Dysuria: Secondary | ICD-10-CM

## 2016-10-30 LAB — POCT URINALYSIS DIPSTICK
Bilirubin, UA: NEGATIVE
GLUCOSE UA: NEGATIVE
Ketones, UA: NEGATIVE
NITRITE UA: NEGATIVE
Protein, UA: NEGATIVE
Spec Grav, UA: <=1.005
UROBILINOGEN UA: 0.2
pH, UA: 7.5

## 2016-10-30 MED ORDER — NITROFURANTOIN MONOHYD MACRO 100 MG PO CAPS: 100.0000 mg | ORAL_CAPSULE | Freq: Two times a day (BID) | ORAL | 0 refills | Status: DC

## 2016-10-30 MED ORDER — PHENAZOPYRIDINE HCL 200 MG PO TABS: 200.0000 mg | ORAL_TABLET | Freq: Three times a day (TID) | ORAL | 0 refills | Status: DC | PRN

## 2016-10-30 NOTE — Progress Notes (Addendum)
Patient: Teresa Torres Female    DOB: Sep 26, 1951   65 y.o.   MRN: [id number] Visit Date: 10/30/2016  Today's Provider: Vernie Murders, PA   Chief Complaint  Patient presents with  . Urinary Tract Infection   Subjective:    Urinary Tract Infection   This is a new problem. Episode onset: Tuesday afternoon. The problem occurs every urination. The problem has been gradually worsening. The quality of the pain is described as burning and aching. Associated symptoms include frequency. Associated symptoms comments: Abdomen pressure and pain. She has tried home medications for the symptoms. The treatment provided mild relief.   Patient Active Problem List   Diagnosis Date Noted  . Arthritis of spine ([place]) 01/21/2016  . Breathlessness on exertion 10/30/2015  . Benign essential HTN 07/30/2015  . Arteriosclerosis of coronary artery 11/17/2014  . Acid reflux 11/17/2014  . Awareness of heartbeats 11/17/2014  . HYPERLIPIDEMIA, MIXED 09/03/2009  . ACID REFLUX DISEASE 09/03/2009   Past Surgical History:  Procedure Laterality Date  . ABDOMINAL HYSTERECTOMY  1984   Partial  . BREAST BIOPSY Right 1972   EXCISIONAL - NEG  . CHOLECYSTECTOMY  2007-2008  . KNEE SURGERY  1980  . TONSILLECTOMY  1961   Family History  Problem Relation Age of Onset  . Breast cancer Paternal Grandmother    Allergies  Allergen Reactions  . Compazine [Prochlorperazine Edisylate] Anaphylaxis  . Aspirin Nausea And Vomiting     Previous Medications   ATORVASTATIN (LIPITOR) 10 MG TABLET    Take 1 tablet (10 mg total) by mouth daily.   DEXLANSOPRAZOLE (DEXILANT) 60 MG CAPSULE    Take by mouth.   NAPROXEN (EC NAPROSYN) 500 MG EC TABLET    Take 1 tablet (500 mg total) by mouth 2 (two) times daily with a meal.    Review of Systems  Constitutional: Negative.   Respiratory: Negative.   Cardiovascular: Negative.   Genitourinary: Positive for dysuria and frequency.    Social History  Substance Use Topics  . Smoking  status: Former Research scientist (life sciences)  . Smokeless tobacco: Never Used     Comment: smoked cigarettes about 1 pack per day; quit in 2007, smoked fro 20 years  . Alcohol use Yes     Comment: Drinks wine; Occasional alcohol use   Objective:   BP (!) 158/88 (BP Location: Right Arm, Patient Position: Sitting, Cuff Size: Normal)   Pulse 75   Temp 97.6 F (36.4 C) (Oral)   Resp 14   Wt 183 lb 6.4 oz (83.2 kg)   BMI 32.23 kg/m   Physical Exam  Constitutional: She is oriented to person, place, and time. She appears well-developed and well-nourished. No distress.  HENT:  Head: Normocephalic and atraumatic.  Right Ear: Hearing normal.  Left Ear: Hearing normal.  Nose: Nose normal.  Eyes: Conjunctivae and lids are normal. Right eye exhibits no discharge. Left eye exhibits no discharge. No scleral icterus.  Neck: Neck supple.  Cardiovascular: Normal rate and regular rhythm.   Pulmonary/Chest: Effort normal and breath sounds normal. No respiratory distress.  Abdominal: Soft. Bowel sounds are normal. There is tenderness.  Suprapubic tenderness/pressure sensation. No CVA tenderness to percuss posteriorly.  Musculoskeletal: Normal range of motion.  Neurological: She is alert and oriented to person, place, and time.  Skin: Skin is intact. No lesion and no rash noted.  Psychiatric: She has a normal mood and affect. Her speech is normal and behavior is normal. Thought content normal.      Assessment &  Plan:     1. Dysuria Onset 2 days ago without hematuria or fever. Frequent urination with pressure in bladder and burning. Urinalysis showed many clumping WBC's on microscopic exam and no hemolyzed blood on dipstick. Continue increased fluid intake, treat with antibiotic and given Pyridium for discomfort. Recheck pending C&S report. - POCT Urinalysis Dipstick - Urine culture - nitrofurantoin, macrocrystal-monohydrate, (MACROBID) 100 MG capsule; Take 1 capsule (100 mg total) by mouth 2 (two) times daily.   Dispense: 20 capsule; Refill: 0 - phenazopyridine (PYRIDIUM) 200 MG tablet; Take 1 tablet (200 mg total) by mouth 3 (three) times daily as needed for pain.  Dispense: 12 tablet; Refill: 0  2. Need for influenza vaccination - Flu Vaccine QUAD 36+ mos PF IM (Fluarix & Fluzone Quad PF)

## 2016-11-01 LAB — URINE CULTURE

## 2016-11-03 ENCOUNTER — Telehealth: Payer: Self-pay

## 2016-11-03 NOTE — Telephone Encounter (Signed)
[id number] 

## 2016-11-03 NOTE — Telephone Encounter (Signed)
-----   Message from Margo Common, [place] sent at 11/03/2016  8:15 AM EST ----- Urine culture isolated E.coli that is sensitive to the Macrodantin given. Finish all the antibiotic and recheck in a week if no better.

## 2016-11-04 NOTE — Telephone Encounter (Signed)
Patient has been advised. KW 

## 2016-11-07 ENCOUNTER — Ambulatory Visit (INDEPENDENT_AMBULATORY_CARE_PROVIDER_SITE_OTHER): Payer: BLUE CROSS/BLUE SHIELD | Admitting: Family Medicine

## 2016-11-07 ENCOUNTER — Other Ambulatory Visit: Payer: Self-pay | Admitting: Emergency Medicine

## 2016-11-07 ENCOUNTER — Encounter: Payer: Self-pay | Admitting: Family Medicine

## 2016-11-07 VITALS — BP 142/80 | HR 84 | Temp 98.4°F | Resp 16 | Wt 180.0 lb

## 2016-11-07 DIAGNOSIS — B349 Viral infection, unspecified: Secondary | ICD-10-CM

## 2016-11-07 DIAGNOSIS — R509 Fever, unspecified: Secondary | ICD-10-CM | POA: Diagnosis not present

## 2016-11-07 DIAGNOSIS — R112 Nausea with vomiting, unspecified: Secondary | ICD-10-CM

## 2016-11-07 LAB — POC INFLUENZA A&B (BINAX/QUICKVUE)
Influenza A, POC: NEGATIVE
Influenza B, POC: NEGATIVE

## 2016-11-07 MED ORDER — OSELTAMIVIR PHOSPHATE 75 MG PO CAPS: 75.0000 mg | ORAL_CAPSULE | Freq: Two times a day (BID) | ORAL | 0 refills | Status: DC

## 2016-11-07 MED ORDER — PROMETHAZINE HCL 25 MG RE SUPP: 25.0000 mg | Freq: Four times a day (QID) | RECTAL | 0 refills | Status: DC | PRN

## 2016-11-07 NOTE — Patient Instructions (Signed)

## 2016-11-07 NOTE — Progress Notes (Signed)
Patient: Teresa Torres Female    DOB: Jun 22, 1951   65 y.o.   MRN: [id number] Visit Date: 11/07/2016  Today's Provider: Vernie Murders, PA   Chief Complaint  Patient presents with  . Fever    bady aches, headaches and sore throat   Subjective:    HPI Pt is here today for fever, body aches, headaches, sore throat and decreased appetite. She reports that it hit her suddenly on Wednesday afternoon about 2:00. "I felt like I'd just hit a wall". Her tempeture has been as high as 101 but said it could have been higher than that the day before but she did not check it. She reports that she feels weak and stomach feels uneasy. Denies cough. Has little nasal congestion. She reports that she does have some shortness of breath. She was here last week for a UTI. She also got her flu vaccine at that time. She reports that the urinary symptoms have resolved but she has not finished her abx because she felt her stomach was uneasy.   Past Medical History:  Diagnosis Date  . Arthritis    of spine  . Hiatal hernia   . Hyperlipidemia    Past Surgical History:  Procedure Laterality Date  . ABDOMINAL HYSTERECTOMY  1984   Partial  . BREAST BIOPSY Right 1972   EXCISIONAL - NEG  . CHOLECYSTECTOMY  2007-2008  . KNEE SURGERY  1980  . TONSILLECTOMY  1961   Family History  Problem Relation Age of Onset  . Breast cancer Paternal Grandmother     Allergies  Allergen Reactions  . Compazine [Prochlorperazine Edisylate] Anaphylaxis  . Aspirin Nausea And Vomiting    Current Outpatient Prescriptions:  .  atorvastatin (LIPITOR) 10 MG tablet, Take 1 tablet (10 mg total) by mouth daily., Disp: 90 tablet, Rfl: 3 .  dexlansoprazole (DEXILANT) 60 MG capsule, Take by mouth., Disp: , Rfl:  .  naproxen (EC NAPROSYN) 500 MG EC tablet, Take 1 tablet (500 mg total) by mouth 2 (two) times daily with a meal., Disp: 60 tablet, Rfl: 2 .  nitrofurantoin, macrocrystal-monohydrate, (MACROBID) 100 MG capsule, Take 1  capsule (100 mg total) by mouth 2 (two) times daily., Disp: 20 capsule, Rfl: 0 .  phenazopyridine (PYRIDIUM) 200 MG tablet, Take 1 tablet (200 mg total) by mouth 3 (three) times daily as needed for pain., Disp: 12 tablet, Rfl: 0  Review of Systems  Constitutional: Positive for appetite change, diaphoresis, fatigue and fever.  HENT: Positive for congestion, sinus pain and sore throat.   Eyes: Negative.   Respiratory: Positive for shortness of breath.   Cardiovascular: Negative.   Gastrointestinal: Positive for nausea.  Endocrine: Negative.   Genitourinary: Negative.   Musculoskeletal: Positive for arthralgias.  Skin: Negative.   Allergic/Immunologic: Negative.   Neurological: Positive for dizziness, light-headedness and headaches.  Hematological: Negative.   Psychiatric/Behavioral: Negative.     Social History  Substance Use Topics  . Smoking status: Former Research scientist (life sciences)  . Smokeless tobacco: Never Used     Comment: smoked cigarettes about 1 pack per day; quit in 2007, smoked fro 20 years  . Alcohol use Yes     Comment: Drinks wine; Occasional alcohol use   Objective:   BP (!) 142/80 (BP Location: Left Arm, Patient Position: Sitting, Cuff Size: Large)   Pulse 84   Temp 98.4 F (36.9 C) (Oral)   Resp 16   Wt 180 lb (81.6 kg)   SpO2 98%  BMI 31.63 kg/m   Physical Exam  Constitutional: She is oriented to person, place, and time. She appears well-developed and well-nourished. No distress.  HENT:  Head: Normocephalic and atraumatic.  Right Ear: Hearing and external ear normal.  Left Ear: Hearing and external ear normal.  Nose: Nose normal.  Mouth/Throat: Oropharynx is clear and moist.  Eyes: Conjunctivae and lids are normal. Right eye exhibits no discharge. Left eye exhibits no discharge. No scleral icterus.  Neck: Neck supple.  Cardiovascular: Normal rate and regular rhythm.   Pulmonary/Chest: Effort normal and breath sounds normal. No respiratory distress.  Abdominal: Soft.  Bowel sounds are normal.  Musculoskeletal: Normal range of motion.  Neurological: She is alert and oriented to person, place, and time.  Skin: Skin is intact. No lesion and no rash noted.  Psychiatric: She has a normal mood and affect. Her speech is normal and behavior is normal. Thought content normal.      Assessment & Plan:     1. Fever, unspecified fever cause Onset 2 days ago with body aches and headache. No cough or congestion. Treated UTI for 1 week prior to this occurring. Flu test negative. Treat empirically with Tamiflu. Increase fluids and use Advil prn. Recheck if no better by Monday. - POC Influenza A&B(BINAX/QUICKVUE) - oseltamivir (TAMIFLU) 75 MG capsule; Take 1 capsule (75 mg total) by mouth 2 (two) times daily.  Dispense: 10 capsule; Refill: 0  2. Viral illness General malaise with headache and fever the past 2-3 days. No cough or congestion. Suspect flu and will treat with Tamiflu. Recheck prn. - oseltamivir (TAMIFLU) 75 MG capsule; Take 1 capsule (75 mg total) by mouth 2 (two) times daily.  Dispense: 10 capsule; Refill: [place], [place]  [facility]

## 2016-11-07 NOTE — Progress Notes (Signed)
Per Simona Huh verbal order to send in promethazine for pt. Pt husband called stating she was vomiting uncontrollably and wanted phenergan called in. Informed him that if she did not get better and has to take all the suppositories, she needed to be seen again. He voiced understanding.

## 2016-12-15 ENCOUNTER — Ambulatory Visit (INDEPENDENT_AMBULATORY_CARE_PROVIDER_SITE_OTHER): Payer: BLUE CROSS/BLUE SHIELD | Admitting: Family Medicine

## 2016-12-15 ENCOUNTER — Encounter: Payer: Self-pay | Admitting: Family Medicine

## 2016-12-15 VITALS — BP 122/78 | HR 56 | Temp 98.6°F | Resp 16 | Wt 170.6 lb

## 2016-12-15 DIAGNOSIS — N3090 Cystitis, unspecified without hematuria: Secondary | ICD-10-CM

## 2016-12-15 LAB — POCT URINALYSIS DIPSTICK
BILIRUBIN UA: NEGATIVE
Blood, UA: NEGATIVE
Glucose, UA: NEGATIVE
Ketones, UA: NEGATIVE
LEUKOCYTES UA: NEGATIVE
NITRITE UA: NEGATIVE
PH UA: 6.5
Protein, UA: NEGATIVE
Spec Grav, UA: <=1.005
UROBILINOGEN UA: 0.2

## 2016-12-15 MED ORDER — SULFAMETHOXAZOLE-TRIMETHOPRIM 800-160 MG PO TABS: 1.0000 | ORAL_TABLET | Freq: Two times a day (BID) | ORAL | 0 refills | Status: DC

## 2016-12-15 NOTE — Progress Notes (Signed)
Subjective:     Patient ID: Teresa Torres, female   DOB: [birth year], 65 y.o.   MRN: [id number]  HPI  Chief Complaint  Patient presents with  . Urinary Frequency    Patient come sin office today with complaints of urinary frequency and urgency for the past 24 hrs. Patient states that las night she took an old prescription for macrobid hoping it would relieve her symptoms. After taking macrobid patient reports fever of 99.5, nausea, vomiting and body chills.  States she has had similar G.I.upset with Macrobid in the past but was hoping she could cope with left over promethazine. Accompanied by her husband today.   Review of Systems  Genitourinary: Negative for vaginal discharge.       Objective:   Physical Exam  Constitutional: She appears well-developed and well-nourished. No distress.  Genitourinary:  Genitourinary Comments: No cva tenderness       Assessment:    1. Cystitis: aliquot of urine to small for culture - sulfamethoxazole-trimethoprim (BACTRIM DS,SEPTRA DS) 800-160 MG tablet; Take 1 tablet by mouth 2 (two) times daily.  Dispense: 14 tablet; Refill: 0    Plan:    Reports she has tolerated Sulfa in the past. Prior E. Coli on culture sensitive to TMP-SMZ. Will call if not improving.

## 2016-12-15 NOTE — Patient Instructions (Signed)
Let us know if you are not improving or can't tolerate the medication.

## 2017-04-27 ENCOUNTER — Ambulatory Visit (INDEPENDENT_AMBULATORY_CARE_PROVIDER_SITE_OTHER): Payer: BLUE CROSS/BLUE SHIELD | Admitting: Family Medicine

## 2017-04-27 ENCOUNTER — Encounter: Payer: Self-pay | Admitting: Family Medicine

## 2017-04-27 VITALS — BP 140/72 | HR 75 | Temp 97.9°F | Wt 174.2 lb

## 2017-04-27 DIAGNOSIS — E782 Mixed hyperlipidemia: Secondary | ICD-10-CM | POA: Diagnosis not present

## 2017-04-27 DIAGNOSIS — H01001 Unspecified blepharitis right upper eyelid: Secondary | ICD-10-CM

## 2017-04-27 MED ORDER — ERYTHROMYCIN 5 MG/GM OP OINT: 1.0000 "application " | TOPICAL_OINTMENT | Freq: Three times a day (TID) | OPHTHALMIC | 0 refills | Status: DC

## 2017-04-27 MED ORDER — ATORVASTATIN CALCIUM 10 MG PO TABS: 10.0000 mg | ORAL_TABLET | Freq: Every day | ORAL | 3 refills | Status: DC

## 2017-04-27 NOTE — Patient Instructions (Signed)
Blepharitis Blepharitis is inflammation of the eyelids. Blepharitis may happen with:  Reddish, scaly skin around the scalp and eyebrows.  Burning or itching of the eyelids.  Eye discharge at night that causes the eyelashes to stick together in the morning.  Eyelashes that fall out.  Sensitivity to light. Follow these instructions at home: Pay attention to any changes in how you look or feel. Follow these instructions to help with your condition: Keeping Clean   Wash your hands often.  Wash your eyelids with warm water or with warm water that is mixed with a small amount of baby shampoo. Do this two times per day or as often as needed.  Wash your face and eyebrows at least once a day.  Use a clean towel each time you dry your eyelids. Do not use this towel to clean or dry other areas of your body. Do not share your towel with anyone. General instructions   Avoid wearing makeup until you get better. Do not share makeup with anyone.  Avoid rubbing your eyes.  Apply warm compresses to your eyes 2 times per day for 10 minutes at a time, or as told by your health care provider.  If you were prescribed an antibiotic ointment or steroid drops, apply or use the medicine as told by your health care provider. Do not stop using the medicine even if you feel better.  Keep all follow-up visits as told by your health care provider. This is important. Contact a health care provider if:  Your eyelids feel hot.  You have blisters or a rash on your eyelids.  The condition does not go away in 2-4 days.  The inflammation gets worse. Get help right away if:  You have pain or redness that gets worse or spreads to other parts of your face.  Your vision changes.  You have pain when looking at lights or moving objects.  You have a fever. This information is not intended to replace advice given to you by your health care provider. Make sure you discuss any questions you have with your health  care provider. Document Released: 12/12/2000 Document Revised: 05/22/2016 Document Reviewed: 04/09/2015 Elsevier Interactive Patient Education  2017 [employer].

## 2017-04-27 NOTE — Progress Notes (Signed)
Patient: Teresa Torres Female    DOB: Apr 30, 1951   66 y.o.   MRN: [id number] Visit Date: 04/27/2017  Today's Provider: Vernie Murders, PA   Chief Complaint  Patient presents with  . Eye Problem   Subjective:    Eye Problem   The right (eye lid) eye is affected. This is a new problem. Episode onset: Friday. The problem occurs constantly. The problem has been gradually improving. There was no injury mechanism. Pain severity now: discomfort. Associated symptoms include eye redness. Pertinent negatives include no blurred vision, eye discharge, itching or photophobia. Associated symptoms comments: Swelling . She has tried nothing for the symptoms.   Patient Active Problem List   Diagnosis Date Noted  . 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 Breathlessness on exertion 10/30/2015  . Benign essential HTN 07/30/2015  . Arteriosclerosis of coronary artery 11/17/2014  . Acid reflux 11/17/2014  . Awareness of heartbeats 11/17/2014  . HYPERLIPIDEMIA, MIXED 09/03/2009  . ACID REFLUX DISEASE 09/03/2009   Past Surgical History:  Procedure Laterality Date  . ABDOMINAL HYSTERECTOMY  1984   Partial  . BREAST BIOPSY Right 1972   EXCISIONAL - NEG  . CHOLECYSTECTOMY  2007-2008  . KNEE SURGERY  1980  . TONSILLECTOMY  1961   Family History  Problem Relation Age of Onset  . Breast cancer Paternal Grandmother    Allergies  Allergen Reactions  . Compazine [Prochlorperazine Edisylate] Anaphylaxis  . Aspirin Nausea And Vomiting  . Nitrofurantoin Nausea And Vomiting     Previous Medications   ATORVASTATIN (LIPITOR) 10 MG TABLET    Take 1 tablet (10 mg total) by mouth daily.   DEXLANSOPRAZOLE (DEXILANT) 60 MG CAPSULE    Take by mouth.   NAPROXEN (EC NAPROSYN) 500 MG EC TABLET    Take 1 tablet (500 mg total) by mouth 2 (two) times daily with a meal.    Review of Systems  Constitutional: Negative.   Eyes: Positive for redness. Negative for blurred vision, photophobia, discharge, itching and  visual disturbance. Eye pain: discomfort.  Respiratory: Negative.   Cardiovascular: Negative.     Social History  Substance Use Topics  . Smoking status: Former Research scientist (life sciences)  . Smokeless tobacco: Never Used     Comment: smoked cigarettes about 1 pack per day; quit in 2007, smoked fro 20 years  . Alcohol use Yes     Comment: Drinks wine; Occasional alcohol use   Objective:   BP 140/72 (BP Location: Right Arm, Patient Position: Sitting, Cuff Size: Normal)   Pulse 75   Temp 97.9 F (36.6 C) (Oral)   Wt 174 lb 3.2 oz (79 kg)   SpO2 99%   BMI 30.61 kg/m   Physical Exam  Constitutional: She is oriented to person, place, and time. She appears well-developed and well-nourished. No distress.  HENT:  Head: Normocephalic and atraumatic.  Right Ear: Hearing normal.  Left Ear: Hearing normal.  Nose: Nose normal.  Eyes: Conjunctivae and lids are normal. Right eye exhibits no discharge. Left eye exhibits no discharge. No scleral icterus.  Swollen right upper eyelid with slight erythema. No drainage from eyes.  Neck: Neck supple.  Pulmonary/Chest: Effort normal. No respiratory distress.  Musculoskeletal: Normal range of motion.  Neurological: She is alert and oriented to person, place, and time.  Skin: Skin is intact. No lesion and no rash noted.  Psychiatric: She has a normal mood and affect. Her speech is normal and behavior is normal. Thought content normal.  Assessment & Plan:     1. Blepharitis of right upper eyelid, unspecified type Onset of redness and swelling of the right upper eyelid over the past 3 days. No drainage from the eye or vision changes. Treat with Erythromycin Ophthalmic Ointment and warm compresses prn. Recheck if no better in 3-4 days. - erythromycin ophthalmic ointment; Place 1 application into the right eye 3 (three) times daily.  Dispense: 3.5 g; Refill: 0  2. HYPERLIPIDEMIA, MIXED Still taking Atrovastatin without side effects. Refilled medication and continue  low fat diet with exercise. Recheck labs and follow up pending reports. Recommend scheduling CPE in the next couple months. - atorvastatin (LIPITOR) 10 MG tablet; Take 1 tablet (10 mg total) by mouth daily.  Dispense: 90 tablet; Refill: 3 - CBC with Differential/Platelet - Comprehensive metabolic panel - Lipid panel - TSH

## 2017-05-08 ENCOUNTER — Encounter: Payer: BLUE CROSS/BLUE SHIELD | Admitting: Family Medicine

## 2017-06-04 ENCOUNTER — Telehealth: Payer: Self-pay

## 2017-06-04 LAB — CBC WITH DIFFERENTIAL/PLATELET
BASOS: 1 %
Basophils Absolute: 0 10*3/uL (ref 0.0–0.2)
EOS (ABSOLUTE): 0.2 10*3/uL (ref 0.0–0.4)
Eos: 2 %
HEMATOCRIT: 42.1 % (ref 34.0–46.6)
HEMOGLOBIN: 14.1 g/dL (ref 11.1–15.9)
IMMATURE GRANS (ABS): 0 10*3/uL (ref 0.0–0.1)
Immature Granulocytes: 0 %
LYMPHS ABS: 3 10*3/uL (ref 0.7–3.1)
LYMPHS: 34 %
MCH: 29.3 pg (ref 26.6–33.0)
MCHC: 33.5 g/dL (ref 31.5–35.7)
MCV: 88 fL (ref 79–97)
MONOCYTES: 7 %
Monocytes Absolute: 0.6 10*3/uL (ref 0.1–0.9)
NEUTROS ABS: 4.9 10*3/uL (ref 1.4–7.0)
Neutrophils: 56 %
Platelets: 348 10*3/uL (ref 150–379)
RBC: 4.81 x10E6/uL (ref 3.77–5.28)
RDW: 13.9 % (ref 12.3–15.4)
WBC: 8.7 10*3/uL (ref 3.4–10.8)

## 2017-06-04 LAB — COMPREHENSIVE METABOLIC PANEL
A/G RATIO: 2 (ref 1.2–2.2)
ALBUMIN: 4.3 g/dL (ref 3.6–4.8)
ALT: 22 IU/L (ref 0–32)
AST: 21 IU/L (ref 0–40)
Alkaline Phosphatase: 62 IU/L (ref 39–117)
BILIRUBIN TOTAL: 0.4 mg/dL (ref 0.0–1.2)
BUN / CREAT RATIO: 11 — AB (ref 12–28)
BUN: 10 mg/dL (ref 8–27)
CHLORIDE: 103 mmol/L (ref 96–106)
CO2: 26 mmol/L (ref 18–29)
Calcium: 9.8 mg/dL (ref 8.7–10.3)
Creatinine, Ser: 0.92 mg/dL (ref 0.57–1.00)
GFR calc non Af Amer: 66 mL/min/{1.73_m2} (ref >59)
GFR, EST AFRICAN AMERICAN: 77 mL/min/{1.73_m2} (ref >59)
GLOBULIN, TOTAL: 2.1 g/dL (ref 1.5–4.5)
Glucose: 91 mg/dL (ref 65–99)
POTASSIUM: 3.9 mmol/L (ref 3.5–5.2)
SODIUM: 144 mmol/L (ref 134–144)
TOTAL PROTEIN: 6.4 g/dL (ref 6.0–8.5)

## 2017-06-04 LAB — LIPID PANEL
Chol/HDL Ratio: 2.9 ratio (ref 0.0–4.4)
Cholesterol, Total: 177 mg/dL (ref 100–199)
HDL: 62 mg/dL (ref >39)
LDL Calculated: 90 mg/dL (ref 0–99)
Triglycerides: 127 mg/dL (ref 0–149)
VLDL CHOLESTEROL CAL: 25 mg/dL (ref 5–40)

## 2017-06-04 LAB — TSH: TSH: 3.54 u[IU]/mL (ref 0.450–4.500)

## 2017-06-04 NOTE — Telephone Encounter (Signed)
Patient advised.

## 2017-06-04 NOTE — Telephone Encounter (Signed)
-----   Message from Margo Common, [place] sent at 06/04/2017  8:19 AM EDT ----- All blood tests back to normal. Continue present medications, diet and exercise plan. Recheck annually.

## 2017-06-09 ENCOUNTER — Encounter: Payer: Self-pay | Admitting: Family Medicine

## 2017-06-09 ENCOUNTER — Ambulatory Visit (INDEPENDENT_AMBULATORY_CARE_PROVIDER_SITE_OTHER): Payer: BLUE CROSS/BLUE SHIELD | Admitting: Family Medicine

## 2017-06-09 VITALS — BP 138/76 | HR 68 | Temp 98.1°F | Ht 64.0 in | Wt 176.6 lb

## 2017-06-09 DIAGNOSIS — K219 Gastro-esophageal reflux disease without esophagitis: Secondary | ICD-10-CM | POA: Diagnosis not present

## 2017-06-09 DIAGNOSIS — M4690 Unspecified inflammatory spondylopathy, site unspecified: Secondary | ICD-10-CM | POA: Diagnosis not present

## 2017-06-09 DIAGNOSIS — E782 Mixed hyperlipidemia: Secondary | ICD-10-CM

## 2017-06-09 DIAGNOSIS — N6320 Unspecified lump in the left breast, unspecified quadrant: Secondary | ICD-10-CM

## 2017-06-09 DIAGNOSIS — M47819 Spondylosis without myelopathy or radiculopathy, site unspecified: Secondary | ICD-10-CM

## 2017-06-09 DIAGNOSIS — Z0000 Encounter for general adult medical examination without abnormal findings: Secondary | ICD-10-CM | POA: Diagnosis not present

## 2017-06-09 LAB — POCT URINALYSIS DIPSTICK
BILIRUBIN UA: NEGATIVE
Blood, UA: NEGATIVE
Glucose, UA: NEGATIVE
KETONES UA: NEGATIVE
LEUKOCYTES UA: NEGATIVE
Nitrite, UA: NEGATIVE
PROTEIN UA: NEGATIVE
Spec Grav, UA: 1.015 (ref 1.010–1.025)
Urobilinogen, UA: 0.2 E.U./dL
pH, UA: 6 (ref 5.0–8.0)

## 2017-06-09 NOTE — Patient Instructions (Signed)

## 2017-06-09 NOTE — Progress Notes (Signed)
Patient: Teresa Torres, Female    DOB: [birth year], 66 y.o.   MRN: [id number] Visit Date: 06/09/2017  Today's Provider: Vernie Murders, PA   Chief Complaint  Patient presents with  . Annual Exam   Subjective:    Annual physical exam Teresa Torres is a 66 y.o. female who presents today for health maintenance and complete physical. She feels well. She reports exercising daily depending on knee pain. She reports she is sleeping fair.  ----------------------------------------------------------------- Last CPE: 02/15/2016 Pap: 02/15/2016 Colonoscopy: 12/05/2013  Review of Systems  Constitutional: Negative.   HENT: Negative.   Eyes: Negative.   Respiratory: Negative.   Cardiovascular: Negative.   Gastrointestinal: Negative.   Endocrine: Negative.   Genitourinary: Negative.   Musculoskeletal: Negative.   Skin: Negative.   Allergic/Immunologic: Negative.   Neurological: Negative.   Hematological: Negative.   Psychiatric/Behavioral: Negative.     Social History      She  reports that she has quit smoking. She has never used smokeless tobacco. She reports that she drinks alcohol. She reports that she does not use drugs.       Social History   Social History  . Marital status: Married    Spouse name: N/A  . Number of children: N/A  . Years of education: N/A   Social History Main Topics  . Smoking status: Former Research scientist (life sciences)  . Smokeless tobacco: Never Used     Comment: smoked cigarettes about 1 pack per day; quit in 2007, smoked fro 20 years  . Alcohol use Yes     Comment: Drinks wine; Occasional alcohol use  . Drug use: No  . Sexual activity: Not Asked   Other Topics Concern  . None   Social History Narrative  . None    Past Medical History:  Diagnosis Date  . Arthritis    of spine  . Hiatal hernia   . Hyperlipidemia      Patient Active Problem List   Diagnosis Date Noted  . 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 Breathlessness on exertion 10/30/2015  .  Benign essential HTN 07/30/2015  . Arteriosclerosis of coronary artery 11/17/2014  . Acid reflux 11/17/2014  . Awareness of heartbeats 11/17/2014  . HYPERLIPIDEMIA, MIXED 09/03/2009  . ACID REFLUX DISEASE 09/03/2009    Past Surgical History:  Procedure Laterality Date  . ABDOMINAL HYSTERECTOMY  1984   Partial  . BREAST BIOPSY Right 1972   EXCISIONAL - NEG  . CHOLECYSTECTOMY  2007-2008  . KNEE SURGERY  1980  . TONSILLECTOMY  1961    Family History        Family Status  Relation Status  . Mother Deceased at age 55       Heart Disease  . Father Deceased at age 2       Died from a motor vehicle  . Brother Deceased       died from a motor vehicle accident  . Son Alive       strokes  . PGM (Not Specified)        Her family history includes Breast cancer in her paternal grandmother.     Allergies  Allergen Reactions  . Compazine [Prochlorperazine Edisylate] Anaphylaxis  . Aspirin Nausea And Vomiting  . Nitrofurantoin Nausea And Vomiting     Current Outpatient Prescriptions:  .  atorvastatin (LIPITOR) 10 MG tablet, Take 1 tablet (10 mg total) by mouth daily., Disp: 90 tablet, Rfl: 3 .  dexlansoprazole (DEXILANT) 60 MG  capsule, Take by mouth., Disp: , Rfl:  .  naproxen (EC NAPROSYN) 500 MG EC tablet, Take 1 tablet (500 mg total) by mouth 2 (two) times daily with a meal., Disp: 60 tablet, Rfl: 2   Patient Care Team: Chrismon, Vickki Muff, PA as PCP - General (Physician Assistant)      Objective:   Vitals: BP 138/76 (BP Location: Right Arm, Patient Position: Sitting, Cuff Size: Normal)   Pulse 68   Temp 98.1 F (36.7 C) (Oral)   Ht 5\' 4"  (1.626 m)   Wt 176 lb 9.6 oz (80.1 kg)   SpO2 98%   BMI 30.31 kg/m    Wt Readings from Last 3 Encounters:  06/09/17 176 lb 9.6 oz (80.1 kg)  04/27/17 174 lb 3.2 oz (79 kg)  12/15/16 170 lb 9.6 oz (77.4 kg)    Vitals:   06/09/17 0914  BP: 138/76  Pulse: 68  Temp: 98.1 F (36.7 C)  TempSrc: Oral  SpO2: 98%  Weight: 176  lb 9.6 oz (80.1 kg)  Height: 5\' 4"  (1.626 m)     Physical Exam  Constitutional: She is oriented to person, place, and time. She appears well-developed and well-nourished.  HENT:  Head: Normocephalic and atraumatic.  Right Ear: External ear normal.  Left Ear: External ear normal.  Nose: Nose normal.  Mouth/Throat: Oropharynx is clear and moist.  Eyes: Conjunctivae and EOM are normal. Pupils are equal, round, and reactive to light. Right eye exhibits no discharge.  Neck: Normal range of motion. Neck supple. No tracheal deviation present. No thyromegaly present.  Cardiovascular: Normal rate, regular rhythm, normal heart sounds and intact distal pulses.   No murmur heard. Pulmonary/Chest: Effort normal and breath sounds normal. No respiratory distress. She has no wheezes. She has no rales. She exhibits no tenderness.  Abdominal: Soft. She exhibits no distension and no mass. There is no tenderness. There is no rebound and no guarding.  Musculoskeletal: Normal range of motion. She exhibits no edema or tenderness.  Enlarged right knee joint with some effusion today. Good ROM and normal gait. 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 Well healed scar.  Lymphadenopathy:    She has no cervical adenopathy.  Neurological: She is alert and oriented to person, place, and time. She has normal reflexes. No cranial nerve deficit. She exhibits normal muscle tone. Coordination normal.  Skin: Skin is warm and dry. No rash noted. No erythema.  Psychiatric: She has a normal mood and affect. Her behavior is normal. Judgment and thought content normal.     Depression Screen PHQ 2/9 Scores 06/09/2017 02/15/2016  PHQ - 2 Score 0 0  PHQ- 9 Score 1 -    Assessment & Plan:     Routine Health Maintenance and Physical Exam  Exercise Activities and Dietary recommendations Goals    Some walking for exercise depending on right knee arthritis. Work in her yard a great  deal. Trying to work on low fat calorie reduction diet.      Immunization History  Administered Date(s) Administered  . Influenza, High Dose Seasonal PF 10/17/2013  . Influenza,inj,Quad PF,36+ Mos 10/30/2016  . Tdap 10/17/2013    Health Maintenance  Topic Date Due  . Hepatitis C Screening  06/09/2018 (Originally 08/12/1951)  . HIV Screening  06/09/2018 (Originally 08/29/1966)  . INFLUENZA VACCINE  07/29/2017  . MAMMOGRAM  03/03/2018  . PAP SMEAR  02/14/2019  . TETANUS/TDAP  10/18/2023  . COLONOSCOPY  12/06/2023  Discussed health benefits of physical activity, and encouraged her to engage in regular exercise appropriate for her age and condition.    --------------------------------------------------------------------  1. Annual physical exam General exam stable. 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 Immunizations up to date. Wants to check with insurance about coverage of shingles vaccination. Last BMD in 2017 showed some osteopenia without osteoporosis. Encouraged to take calcium and vitamin-D supplement. PAP last year was normal. Colonoscopy 12-05-13 by Dr. Oletta Lamas (gastroenterologist) normal. - POCT Urinalysis Dipstick Recent Results (from the past 2160 hour(s))  CBC with Differential/Platelet     Status: None   Collection Time: 06/03/17  8:52 AM  Result Value Ref Range   WBC 8.7 3.4 - 10.8 x10E3/uL   RBC 4.81 3.77 - 5.28 x10E6/uL   Hemoglobin 14.1 11.1 - 15.9 g/dL   Hematocrit 42.1 34.0 - 46.6 %   MCV 88 79 - 97 fL   MCH 29.3 26.6 - 33.0 pg   MCHC 33.5 31.5 - 35.7 g/dL   RDW 13.9 12.3 - 15.4 %   Platelets 348 150 - 379 x10E3/uL   Neutrophils 56 Not Estab. %   Lymphs 34 Not Estab. %   Monocytes 7 Not Estab. %   Eos 2 Not Estab. %   Basos 1 Not Estab. %   Neutrophils Absolute 4.9 1.4 - 7.0 x10E3/uL   Lymphocytes Absolute 3.0 0.7 - 3.1 x10E3/uL   Monocytes Absolute 0.6  0.1 - 0.9 x10E3/uL   EOS (ABSOLUTE) 0.2 0.0 - 0.4 x10E3/uL   Basophils Absolute 0.0 0.0 - 0.2 x10E3/uL   Immature Granulocytes 0 Not Estab. %   Immature Grans (Abs) 0.0 0.0 - 0.1 x10E3/uL  Comprehensive metabolic panel     Status: Abnormal   Collection Time: 06/03/17  8:52 AM  Result Value Ref Range   Glucose 91 65 - 99 mg/dL   BUN 10 8 - 27 mg/dL   Creatinine, Ser 0.92 0.57 - 1.00 mg/dL   GFR calc non Af Amer 66 >59 mL/min/1.73   GFR calc Af Amer 77 >59 mL/min/1.73   BUN/Creatinine Ratio 11 (L) 12 - 28   Sodium 144 134 - 144 mmol/L   Potassium 3.9 3.5 - 5.2 mmol/L   Chloride 103 96 - 106 mmol/L   CO2 26 18 - 29 mmol/L    Comment: **Effective June 08, 2017 Carbon Dioxide, Total**   reference interval will be changing to:              Age                  Female          Female      0 days   - 30 days         16 - 34        16 - 72     31 days   -  1 year         15 - 59        15 - 25      2 years  -  5 years        73 - 86        17 - 58      6 years  - 12 years        1 - 17        19 - 6                >  12 years        20 - 29        20 - 29    Calcium 9.8 8.7 - 10.3 mg/dL   Total Protein 6.4 6.0 - 8.5 g/dL   Albumin 4.3 3.6 - 4.8 g/dL   Globulin, Total 2.1 1.5 - 4.5 g/dL   Albumin/Globulin Ratio 2.0 1.2 - 2.2   Bilirubin Total 0.4 0.0 - 1.2 mg/dL   Alkaline Phosphatase 62 39 - 117 IU/L   AST 21 0 - 40 IU/L   ALT 22 0 - 32 IU/L  Lipid panel     Status: None   Collection Time: 06/03/17  8:52 AM  Result Value Ref Range   Cholesterol, Total 177 100 - 199 mg/dL   Triglycerides 127 0 - 149 mg/dL   HDL 62 >39 mg/dL   VLDL Cholesterol Cal 25 5 - 40 mg/dL   LDL Calculated 90 0 - 99 mg/dL   Chol/HDL Ratio 2.9 0.0 - 4.4 ratio    Comment:                                   T. Chol/HDL Ratio                                             Men  Women                               1/2 Avg.Risk  3.4    3.3                                   Avg.Risk  5.0    4.4                                 2X Avg.Risk  9.6    7.1                                3X Avg.Risk 23.4   11.0   TSH     Status: None   Collection Time: 06/03/17  8:52 AM  Result Value Ref Range   TSH 3.540 0.450 - 4.500 uIU/mL  POCT Urinalysis Dipstick     Status: None   Collection Time: 06/09/17  9:23 AM  Result Value Ref Range   Color, UA yellow    Clarity, UA clear    Glucose, UA negative    Bilirubin, UA negative    Ketones, UA negative    Spec Grav, UA 1.015 1.010 - 1.025   Blood, UA negative    pH, UA 6.0 5.0 - 8.0   Protein, UA negative    Urobilinogen, UA 0.2 0.2 or 1.0 E.U./dL   Nitrite, UA negative    Leukocytes, UA Negative Negative     2. Gastroesophageal reflux disease, esophagitis presence not specified Followed by Dr. Oletta Lamas (gastroenterologist in [place]). Last visit was a week or two ago. Continued Dexilant and planning upper endoscopy later this year.  3. HYPERLIPIDEMIA, MIXED Has gained 6 lbs since December 2017 but labs on 06-03-17  showed very good lipid levels. Continues to take the Lipitor 10 mg qd and trying to follow low fat diet. Encouraged to work on weight loss. Follow up annually. Lab Results  Component Value Date   CHOL 177 06/03/2017   HDL 62 06/03/2017   LDLCALC 90 06/03/2017   TRIG 127 06/03/2017   CHOLHDL 2.9 06/03/2017     4. Arthritis of spine ([facility]) Aches and pains controlled with prn use of EC-Naprosyn. No radiculopathy.  5. Left breast mass Mammograms in 2017 showed a suspected benign 3x3x4 mm mass in the left breast 1 cm from the nipple at 11 o'clock with recommendation for repeat exams this year. Schedule diagnostic tests. - MM Digital Diagnostic Bilat - US BREAST COMPLETE UNI LEFT INC AXILLA   Vernie Murders, [facility]

## 2017-07-07 IMAGING — MG MM DIAG BREAST TOMO UNI LEFT
6 series · 6 of 14 positions shown · non-contrast
Comparison: Previous exam(s).

CLINICAL DATA: Patient presents for additional views of the left
breast to evaluate the possible mass over the inner upper
periareolar region.

EXAM:
DIGITAL DIAGNOSTIC left MAMMOGRAM WITH CAD
ULTRASOUND left BREAST

[L CC]
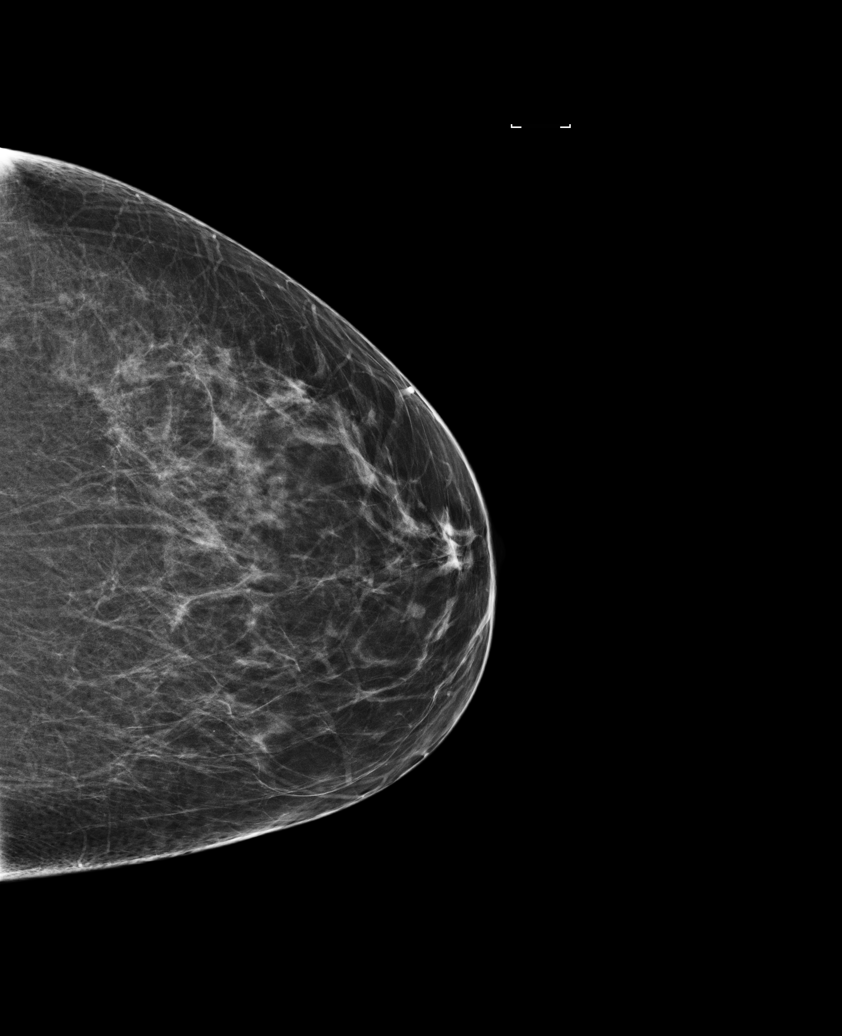

[L MLO]
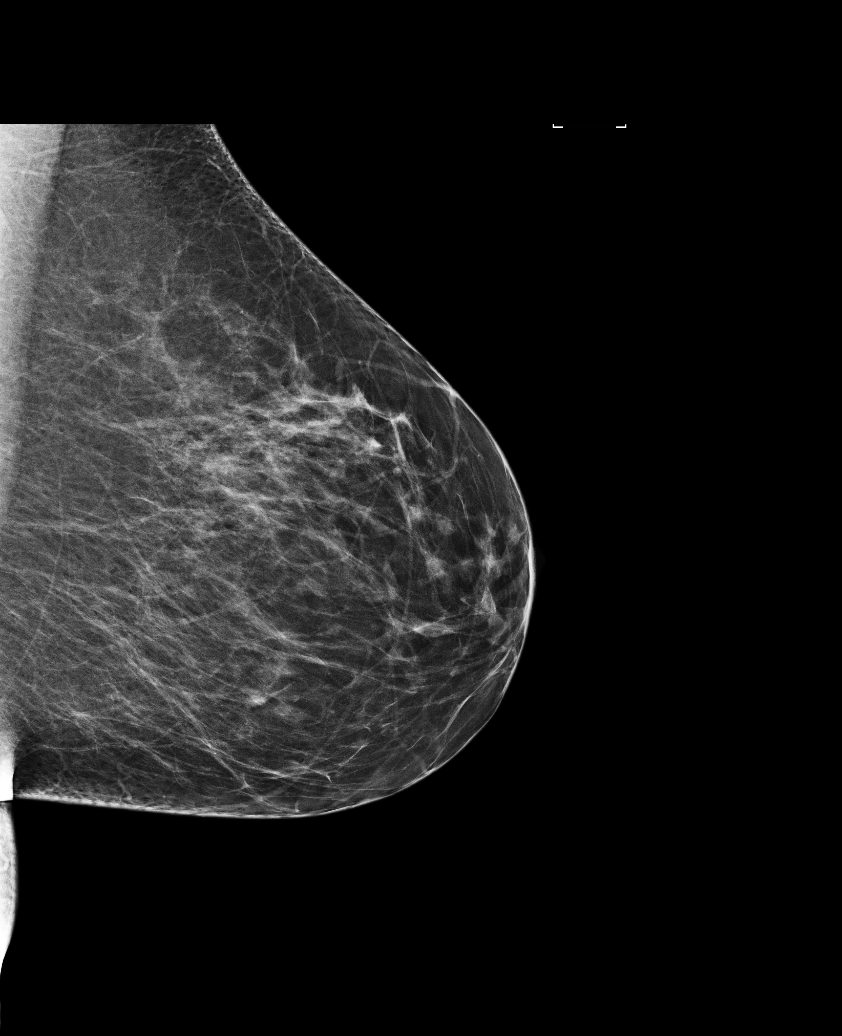

[L CC synth-2D]
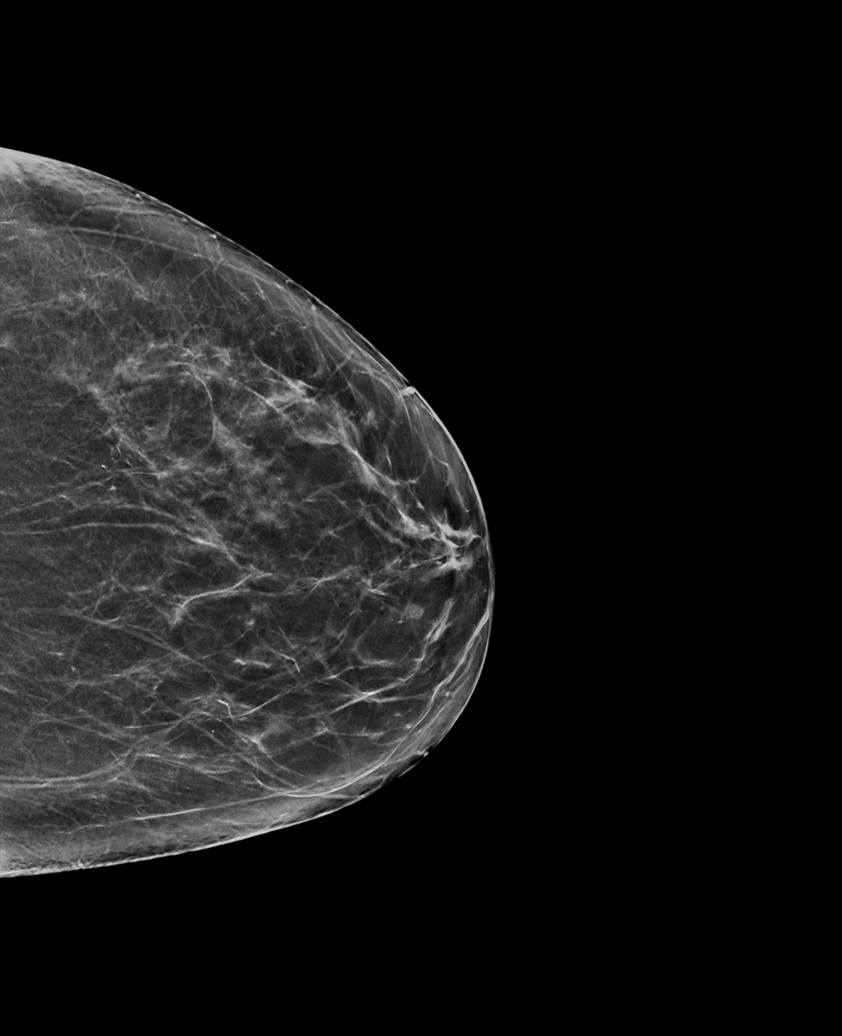

[L MLO synth-2D]
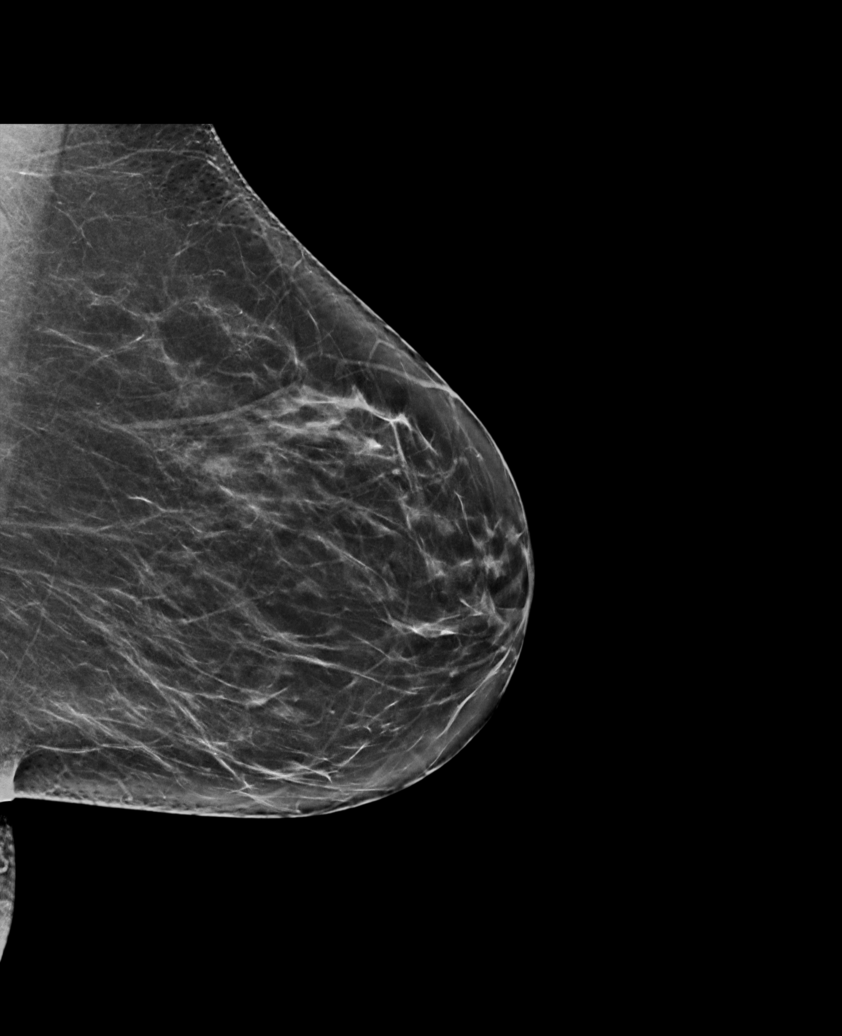

[L MLO tomo · tomo slice 35/69.0]
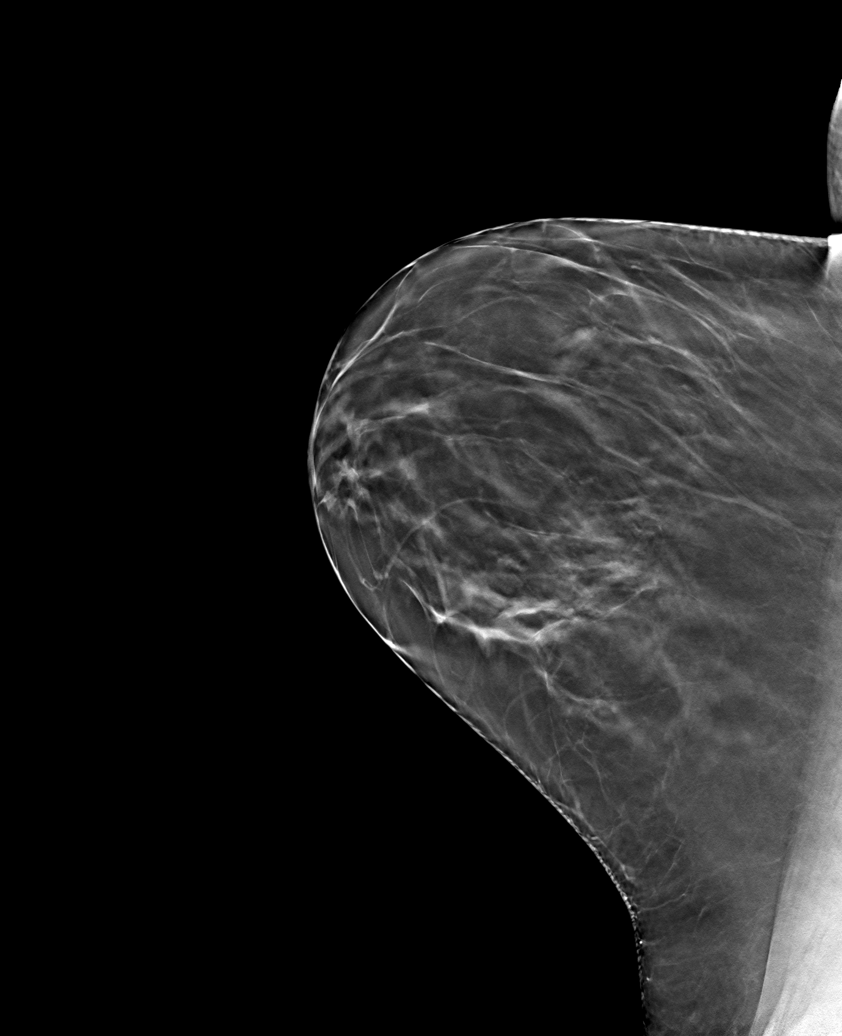

[L CC tomo · tomo slice 34/67.0]
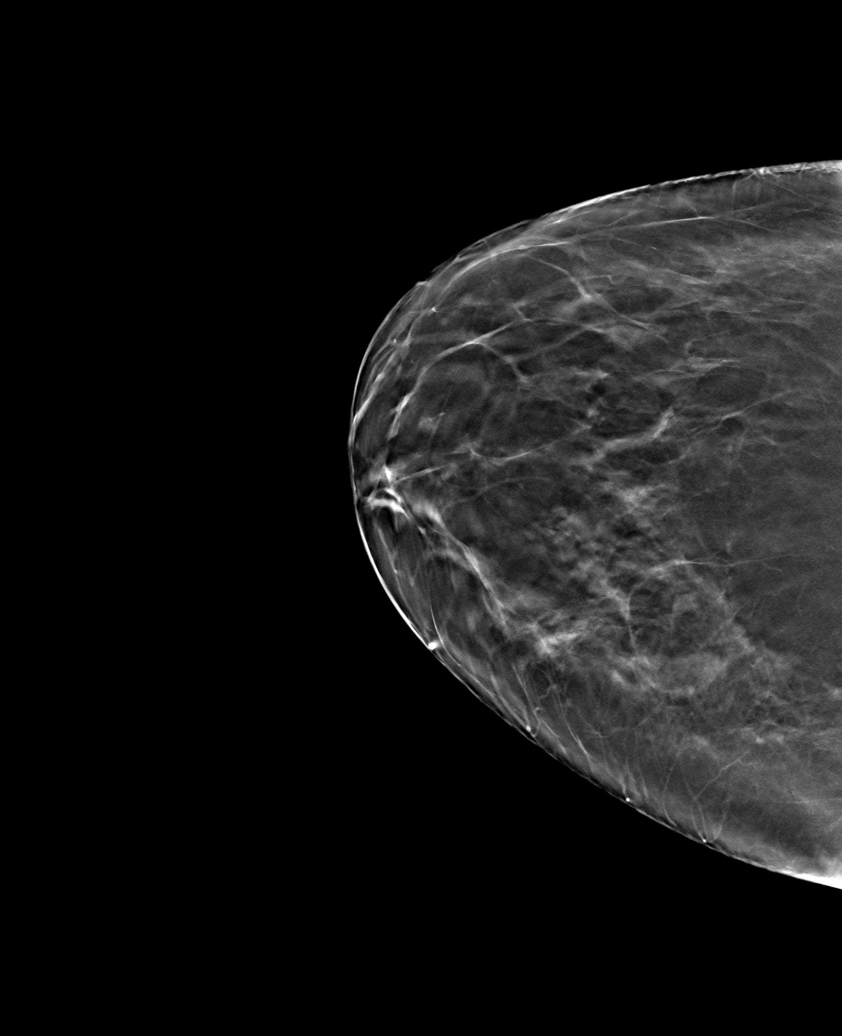

[6 of 14 positions shown; findings below may reference images not displayed]

ACR Breast Density Category b: There are scattered areas of
fibroglandular density.
FINDINGS: Examination demonstrates a round circumscribed 3 mm mass over the
inner upper left periareolar region.

Mammographic images were processed with CAD.

Targeted ultrasound is performed, showing a round circumscribed
hypoechoic mass over the 11 o'clock position of the left breast 1 cm
from the nipple measuring 3 x 3 x 4 mm. This likely represents a
benign process such as a focus of apocrine metaplasia or fat
necrosis.
IMPRESSION: Probable benign mass over the 11 o'clock position of the left breast
1 cm from the nipple measuring 3 x 3 x 4 mm.

RECOMMENDATION:
Recommend six-month followup diagnostic left breast mammogram and
ultrasound to document stability of this probable benign finding.

I have discussed the findings and recommendations with the patient.
Results were also provided in writing at the conclusion of the
visit. If applicable, a reminder letter will be sent to the patient
regarding the next appointment.

BI-RADS CATEGORY  3: Probably benign.

## 2017-07-10 ENCOUNTER — Telehealth: Payer: Self-pay

## 2017-07-10 ENCOUNTER — Ambulatory Visit
Admission: RE | Admit: 2017-07-10 | Discharge: 2017-07-10 | Disposition: A | Discharge status: Home / Self Care | Payer: BLUE CROSS/BLUE SHIELD | Source: Ambulatory Visit | Attending: Family Medicine | Admitting: Family Medicine

## 2017-07-10 DIAGNOSIS — N6322 Unspecified lump in the left breast, upper inner quadrant: Secondary | ICD-10-CM | POA: Diagnosis not present

## 2017-07-10 DIAGNOSIS — N6320 Unspecified lump in the left breast, unspecified quadrant: Secondary | ICD-10-CM

## 2017-07-10 NOTE — Telephone Encounter (Signed)
Patient advised.

## 2017-07-10 NOTE — Telephone Encounter (Signed)
-----   Message from Margo Common, [place] sent at 07/10/2017  2:38 PM EDT ----- Nearly complete resolution of the nodule seen in the left breast in the past. Otherwise, normal mammograms.  Radiologist recommended screening mammograms next year.

## 2018-02-04 ENCOUNTER — Other Ambulatory Visit: Payer: Self-pay | Admitting: Family Medicine

## 2018-02-04 DIAGNOSIS — E782 Mixed hyperlipidemia: Secondary | ICD-10-CM

## 2018-02-26 ENCOUNTER — Encounter: Payer: Self-pay | Admitting: Family Medicine

## 2018-02-26 ENCOUNTER — Ambulatory Visit (INDEPENDENT_AMBULATORY_CARE_PROVIDER_SITE_OTHER): Payer: BLUE CROSS/BLUE SHIELD | Admitting: Family Medicine

## 2018-02-26 VITALS — BP 134/84 | HR 72 | Temp 97.9°F | Wt 179.4 lb

## 2018-02-26 DIAGNOSIS — R300 Dysuria: Secondary | ICD-10-CM

## 2018-02-26 LAB — POCT URINALYSIS DIPSTICK
Bilirubin, UA: NEGATIVE
GLUCOSE UA: NEGATIVE
KETONES UA: NEGATIVE
Nitrite, UA: NEGATIVE
Protein, UA: NEGATIVE
Urobilinogen, UA: 0.2 E.U./dL
pH, UA: 6 (ref 5.0–8.0)

## 2018-02-26 MED ORDER — SULFAMETHOXAZOLE-TRIMETHOPRIM 800-160 MG PO TABS: 1.0000 | ORAL_TABLET | Freq: Two times a day (BID) | ORAL | 0 refills | Status: DC

## 2018-02-26 NOTE — Progress Notes (Signed)
Patient: Teresa Torres Female    DOB: [birth year]   67 y.o.   MRN: [id number] Visit Date: 02/26/2018  Today's Provider: Vernie Murders, PA   Chief Complaint  Patient presents with  . Urinary Tract Infection   Subjective:    Urinary Tract Infection   This is a new problem. Episode onset: 2 days ago. The problem has been gradually worsening. The quality of the pain is described as burning and aching (pressure ). There has been no fever. Associated symptoms include frequency. Associated symptoms comments: Back pain. Treatments tried: Cystex. The treatment provided mild relief. Her past medical history is significant for recurrent UTIs.   Past Medical History:  Diagnosis Date  . Arthritis    of spine  . Hiatal hernia   . Hyperlipidemia    Past Surgical History:  Procedure Laterality Date  . ABDOMINAL HYSTERECTOMY  1984   Partial  . BREAST BIOPSY Right 1972   EXCISIONAL - NEG  . CHOLECYSTECTOMY  2007-2008  . KNEE SURGERY  1980  . TONSILLECTOMY  1961   Family History  Problem Relation Age of Onset  . Breast cancer Paternal Grandmother    Allergies  Allergen Reactions  . Compazine [Prochlorperazine Edisylate] Anaphylaxis  . Aspirin Nausea And Vomiting  . Nitrofurantoin Nausea And Vomiting    Current Outpatient Medications:  .  atorvastatin (LIPITOR) 10 MG tablet, TAKE 1 TABLET BY MOUTH ONCE DAILY, Disp: 90 tablet, Rfl: 3 .  dexlansoprazole (DEXILANT) 60 MG capsule, Take by mouth., Disp: , Rfl:  .  naproxen (EC NAPROSYN) 500 MG EC tablet, Take 1 tablet (500 mg total) by mouth 2 (two) times daily with a meal., Disp: 60 tablet, Rfl: 2  Review of Systems  Constitutional: Negative.   Respiratory: Negative.   Cardiovascular: Negative.   Genitourinary: Positive for dysuria and frequency.   Social History   Tobacco Use  . Smoking status: Former Research scientist (life sciences)  . Smokeless tobacco: Never Used  . Tobacco comment: smoked cigarettes about 1 pack per day; quit in 2007, smoked fro  20 years  Substance Use Topics  . Alcohol use: Yes    Comment: Drinks wine; Occasional alcohol use   Objective:   BP 134/84 (BP Location: Right Arm, Patient Position: Sitting, Cuff Size: Normal)   Pulse 72   Temp 97.9 F (36.6 C) (Oral)   Wt 179 lb 6.4 oz (81.4 kg)   SpO2 98%   BMI 30.79 kg/m   Physical Exam  Constitutional: She is oriented to person, place, and time. She appears well-developed and well-nourished. No distress.  HENT:  Head: Normocephalic and atraumatic.  Right Ear: Hearing normal.  Left Ear: Hearing normal.  Nose: Nose normal.  Eyes: Conjunctivae and lids are normal. Right eye exhibits no discharge. Left eye exhibits no discharge. No scleral icterus.  Neck: Neck supple.  Cardiovascular: Normal rate.  Pulmonary/Chest: Effort normal. No respiratory distress.  Abdominal: Soft. Bowel sounds are normal. There is tenderness.  Suprapubic discomfort to palpation. Slight posterior CVA tenderness to percussion.  Musculoskeletal: Normal range of motion.  Neurological: She is alert and oriented to person, place, and time.  Skin: Skin is intact. No lesion and no rash noted.  Psychiatric: She has a normal mood and affect. Her speech is normal and behavior is normal. Thought content normal.      Assessment & Plan:     1. Dysuria Onset the past 2 days with urinary frequency. Urinalysis shows pyuria. Feels this began after having  a diarrhea episode that was "splashy". Will treat with increased fluid intake, AZO-Standard for discomfort and add antibiotic. Will get C&S started to identify the pathogen. Recheck pending report. - POCT Urinalysis Dipstick - Urine Culture - sulfamethoxazole-trimethoprim (BACTRIM DS,SEPTRA DS) 800-160 MG tablet; Take 1 tablet by mouth 2 (two) times daily.  Dispense: 20 tablet; Refill: [place], [place]  [facility]

## 2018-02-28 LAB — URINE CULTURE

## 2019-10-14 ENCOUNTER — Ambulatory Visit (INDEPENDENT_AMBULATORY_CARE_PROVIDER_SITE_OTHER): Payer: Managed Care, Other (non HMO) | Admitting: Family Medicine

## 2019-10-14 ENCOUNTER — Other Ambulatory Visit: Payer: Self-pay

## 2019-10-14 ENCOUNTER — Encounter: Payer: Self-pay | Admitting: Family Medicine

## 2019-10-14 DIAGNOSIS — H9201 Otalgia, right ear: Secondary | ICD-10-CM

## 2019-10-14 MED ORDER — AMOXICILLIN 875 MG PO TABS: 875.0000 mg | ORAL_TABLET | Freq: Two times a day (BID) | ORAL | 0 refills | Status: DC

## 2019-10-14 NOTE — Progress Notes (Signed)
Patient: Teresa Torres Female    DOB: 02-26-1951   68 y.o.   MRN: [id number] Visit Date: 10/14/2019  Today's Provider: Vernie Murders, PA   No chief complaint on file.  Subjective:    Virtual Visit via Video Note  I connected with Teresa Torres on 10/14/19 at  3:00 PM EDT by a video enabled telemedicine application and verified that I am speaking with the correct person using two identifiers.  Location: Patient: Home Provider: Office   I discussed the limitations of evaluation and management by telemedicine and the availability of in person appointments. The patient expressed understanding and agreed to proceed.   HPI: This 68 year old female developed right ear ache/pressure over the past 4-5 days. Some discomfort in the ear to swallow with a dry throat sensation on the right. Has been using Flonase for suspected eustachian dysfunction. No cough, rhinorrhea or fever.  Past Medical History:  Diagnosis Date  . Arthritis    of spine  . Hiatal hernia   . Hyperlipidemia    Past Surgical History:  Procedure Laterality Date  . ABDOMINAL HYSTERECTOMY  1984   Partial  . BREAST BIOPSY Right 1972   EXCISIONAL - NEG  . CHOLECYSTECTOMY  2007-2008  . KNEE SURGERY  1980  . TONSILLECTOMY  1961   Family History  Problem Relation Age of Onset  . Breast cancer Paternal Grandmother    Allergies  Allergen Reactions  . Compazine [Prochlorperazine Edisylate] Anaphylaxis  . Aspirin Nausea And Vomiting  . Nitrofurantoin Nausea And Vomiting    Current Outpatient Medications:  .  atorvastatin (LIPITOR) 10 MG tablet, TAKE 1 TABLET BY MOUTH ONCE DAILY, Disp: 90 tablet, Rfl: 3 .  dexlansoprazole (DEXILANT) 60 MG capsule, Take by mouth., Disp: , Rfl:  .  naproxen (EC NAPROSYN) 500 MG EC tablet, Take 1 tablet (500 mg total) by mouth 2 (two) times daily with a meal., Disp: 60 tablet, Rfl: 2 .  sulfamethoxazole-trimethoprim (BACTRIM DS,SEPTRA DS) 800-160 MG tablet, Take 1 tablet by mouth  2 (two) times daily., Disp: 20 tablet, Rfl: 0  Review of Systems  Constitutional: Negative for chills, fatigue and fever.  HENT: Positive for ear pain and postnasal drip. Negative for rhinorrhea and sinus pressure.   Respiratory: Negative.   Cardiovascular: Negative.   Gastrointestinal: Negative.    Social History   Tobacco Use  . Smoking status: Former Research scientist (life sciences)  . Smokeless tobacco: Never Used  . Tobacco comment: smoked cigarettes about 1 pack per day; quit in 2007, smoked fro 20 years  Substance Use Topics  . Alcohol use: Yes    Comment: Drinks wine; Occasional alcohol use     Objective:   There were no vitals taken for this visit. There were no vitals filed for this visit.There is no height or weight on file to calculate BMI.  WDWN female in no apparent distress.  Head: Normocephalic, atraumatic. Neck: Supple, NROM Respiratory: No apparent distress Psych: Normal mood and affect     Assessment & Plan     1. Acute otalgia, right Developed right ear ache over the past 5 days with pressure sensation. No hearing loss. More pain with swallowing and dry sensation to throat on the right. May continue Flonase Nasal Spray at bedtime. May add Mucinex-DM and Advil prn. Add Amoxil for suspected otitis media. Advised to go for COVID testing if fever, shortness of breath, loss of taste/smell or cough over the weekend. Increase fluid intake, wash hands  frequently, wear mask and maintain social distancing (pandemic restrictions). - amoxicillin (AMOXIL) 875 MG tablet; Take 1 tablet (875 mg total) by mouth 2 (two) times daily.  Dispense: 20 tablet; Refill: 0   I discussed the assessment and treatment plan with the patient. The patient was provided an opportunity to ask questions and all were answered. The patient agreed with the plan and demonstrated an understanding of the instructions.   The patient was advised to call back or seek an in-person evaluation if the symptoms worsen or if the  condition fails to improve as anticipated.  I provided 14 minutes of non-face-to-face time during this encounter.      Vernie Murders, PA  [facility]

## 2019-12-21 ENCOUNTER — Ambulatory Visit: Payer: Managed Care, Other (non HMO) | Attending: Internal Medicine

## 2019-12-21 DIAGNOSIS — Z20822 Contact with and (suspected) exposure to covid-19: Secondary | ICD-10-CM

## 2019-12-22 LAB — NOVEL CORONAVIRUS, NAA: SARS-CoV-2, NAA: NOT DETECTED

## 2020-01-05 ENCOUNTER — Other Ambulatory Visit: Payer: Self-pay

## 2020-01-05 ENCOUNTER — Encounter: Payer: Self-pay | Admitting: Physician Assistant

## 2020-01-05 ENCOUNTER — Ambulatory Visit (INDEPENDENT_AMBULATORY_CARE_PROVIDER_SITE_OTHER): Payer: Managed Care, Other (non HMO) | Admitting: Physician Assistant

## 2020-01-05 VITALS — BP 171/97 | HR 78 | Temp 96.8°F | Wt 178.0 lb

## 2020-01-05 DIAGNOSIS — E782 Mixed hyperlipidemia: Secondary | ICD-10-CM

## 2020-01-05 DIAGNOSIS — Z6830 Body mass index (BMI) 30.0-30.9, adult: Secondary | ICD-10-CM

## 2020-01-05 DIAGNOSIS — E6609 Other obesity due to excess calories: Secondary | ICD-10-CM

## 2020-01-05 DIAGNOSIS — N3090 Cystitis, unspecified without hematuria: Secondary | ICD-10-CM | POA: Diagnosis not present

## 2020-01-05 LAB — POCT URINALYSIS DIPSTICK
Bilirubin, UA: NEGATIVE
Glucose, UA: NEGATIVE
Ketones, UA: NEGATIVE
Nitrite, UA: POSITIVE
Protein, UA: NEGATIVE
Spec Grav, UA: 1.025 (ref 1.010–1.025)
Urobilinogen, UA: 0.2 E.U./dL
pH, UA: 5 (ref 5.0–8.0)

## 2020-01-05 MED ORDER — ATORVASTATIN CALCIUM 10 MG PO TABS: 10.0000 mg | ORAL_TABLET | Freq: Every day | ORAL | 3 refills | Status: DC

## 2020-01-05 MED ORDER — SULFAMETHOXAZOLE-TRIMETHOPRIM 800-160 MG PO TABS: 1.0000 | ORAL_TABLET | Freq: Two times a day (BID) | ORAL | 0 refills | Status: DC

## 2020-01-05 NOTE — Patient Instructions (Signed)
Urinary Tract Infection, Adult A urinary tract infection (UTI) is an infection of any part of the urinary tract. The urinary tract includes:  The kidneys.  The ureters.  The bladder.  The urethra. These organs make, store, and get rid of pee (urine) in the body. What are the causes? This is caused by germs (bacteria) in your genital area. These germs grow and cause swelling (inflammation) of your urinary tract. What increases the risk? You are more likely to develop this condition if:  You have a small, thin tube (catheter) to drain pee.  You cannot control when you pee or poop (incontinence).  You are female, and: ? You use these methods to prevent pregnancy:  A medicine that kills sperm (spermicide).  A device that blocks sperm (diaphragm). ? You have low levels of a female hormone (estrogen). ? You are pregnant.  You have genes that add to your risk.  You are sexually active.  You take antibiotic medicines.  You have trouble peeing because of: ? A prostate that is bigger than normal, if you are female. ? A blockage in the part of your body that drains pee from the bladder (urethra). ? A kidney stone. ? A nerve condition that affects your bladder (neurogenic bladder). ? Not getting enough to drink. ? Not peeing often enough.  You have other conditions, such as: ? Diabetes. ? A weak disease-fighting system (immune system). ? Sickle cell disease. ? Gout. ? Injury of the spine. What are the signs or symptoms? Symptoms of this condition include:  Needing to pee right away (urgently).  Peeing often.  Peeing small amounts often.  Pain or burning when peeing.  Blood in the pee.  Pee that smells bad or not like normal.  Trouble peeing.  Pee that is cloudy.  Fluid coming from the vagina, if you are female.  Pain in the belly or lower back. Other symptoms include:  Throwing up (vomiting).  No urge to eat.  Feeling mixed up (confused).  Being tired  and grouchy (irritable).  A fever.  Watery poop (diarrhea). How is this treated? This condition may be treated with:  Antibiotic medicine.  Other medicines.  Drinking enough water. Follow these instructions at home:  Medicines  Take over-the-counter and prescription medicines only as told by your doctor.  If you were prescribed an antibiotic medicine, take it as told by your doctor. Do not stop taking it even if you start to feel better. General instructions  Make sure you: ? Pee until your bladder is empty. ? Do not hold pee for a long time. ? Empty your bladder after sex. ? Wipe from front to back after pooping if you are a female. Use each tissue one time when you wipe.  Drink enough fluid to keep your pee pale yellow.  Keep all follow-up visits as told by your doctor. This is important. Contact a doctor if:  You do not get better after 1-2 days.  Your symptoms go away and then come back. Get help right away if:  You have very bad back pain.  You have very bad pain in your lower belly.  You have a fever.  You are sick to your stomach (nauseous).  You are throwing up. Summary  A urinary tract infection (UTI) is an infection of any part of the urinary tract.  This condition is caused by germs in your genital area.  There are many risk factors for a UTI. These include having a small, thin   tube to drain pee and not being able to control when you pee or poop.  Treatment includes antibiotic medicines for germs.  Drink enough fluid to keep your pee pale yellow. This information is not intended to replace advice given to you by your health care provider. Make sure you discuss any questions you have with your health care provider. Document Revised: 12/02/2018 Document Reviewed: 06/24/2018 Elsevier Patient Education  2020 [employer]  

## 2020-01-05 NOTE — Progress Notes (Signed)
Patient: Teresa Torres Female    DOB: 22-Apr-1951   69 y.o.   MRN: [id number] Visit Date: 01/10/2020  Today's Provider: Mar Daring, PA-C   Chief Complaint  Patient presents with  . Urinary Tract Infection   Subjective:     Urinary Tract Infection  This is a new problem. The current episode started yesterday. The problem has been gradually worsening. Associated symptoms include frequency and urgency. Pertinent negatives include no chills, discharge, flank pain, hematuria, hesitancy, nausea, possible pregnancy, sweats or vomiting.    Allergies  Allergen Reactions  . Compazine [Prochlorperazine Edisylate] Anaphylaxis  . Aspirin Nausea And Vomiting  . Nitrofurantoin Nausea And Vomiting     Current Outpatient Medications:  .  atorvastatin (LIPITOR) 10 MG tablet, Take 1 tablet (10 mg total) by mouth daily., Disp: 90 tablet, Rfl: 3 .  naproxen (EC NAPROSYN) 500 MG EC tablet, Take 1 tablet (500 mg total) by mouth 2 (two) times daily with a meal., Disp: 60 tablet, Rfl: 2 .  pantoprazole (PROTONIX) 40 MG tablet, Take 40 mg by mouth daily., Disp: , Rfl:  .  dexlansoprazole (DEXILANT) 60 MG capsule, Take by mouth., Disp: , Rfl:  .  sulfamethoxazole-trimethoprim (BACTRIM DS) 800-160 MG tablet, Take 1 tablet by mouth 2 (two) times daily., Disp: 14 tablet, Rfl: 0  Review of Systems  Constitutional: Positive for fatigue. Negative for activity change, appetite change, chills, diaphoresis, fever and unexpected weight change.  Gastrointestinal: Positive for abdominal distention. Negative for abdominal pain, anal bleeding, blood in stool, constipation, diarrhea, nausea, rectal pain and vomiting.  Genitourinary: Positive for dysuria, frequency and urgency. Negative for decreased urine volume, difficulty urinating, dyspareunia, flank pain, hematuria, hesitancy, vaginal bleeding, vaginal discharge and vaginal pain.  Musculoskeletal: Positive for back pain.    Social History   Tobacco  Use  . Smoking status: Former Research scientist (life sciences)  . Smokeless tobacco: Never Used  . Tobacco comment: smoked cigarettes about 1 pack per day; quit in 2007, smoked fro 20 years  Substance Use Topics  . Alcohol use: Yes    Comment: Drinks wine; Occasional alcohol use      Objective:   BP (!) 171/97 (BP Location: Left Arm, Patient Position: Sitting, Cuff Size: Large)   Pulse 78   Temp (!) 96.8 F (36 C) (Temporal)   Wt 178 lb (80.7 kg)   BMI 30.55 kg/m  Vitals:   01/05/20 1514  BP: (!) 171/97  Pulse: 78  Temp: (!) 96.8 F (36 C)  TempSrc: Temporal  Weight: 178 lb (80.7 kg)  Body mass index is 30.55 kg/m.   Physical Exam Vitals reviewed.  Constitutional:      General: She is not in acute distress.    Appearance: Normal appearance. She is well-developed and normal weight. She is not ill-appearing or diaphoretic.  Cardiovascular:     Rate and Rhythm: Normal rate and regular rhythm.     Heart sounds: Normal heart sounds. No murmur. No friction rub. No gallop.   Pulmonary:     Effort: Pulmonary effort is normal. No respiratory distress.     Breath sounds: Normal breath sounds. No wheezing or rales.  Abdominal:     General: Abdomen is flat. Bowel sounds are normal. There is no distension.     Palpations: Abdomen is soft. There is no mass.     Tenderness: There is abdominal tenderness in the suprapubic area. There is no guarding or rebound.     Comments: Suprapubic pressure  not pain  Skin:    General: Skin is warm and dry.  Neurological:     Mental Status: She is alert and oriented to person, place, and time.      Results for orders placed or performed in visit on 01/05/20  CULTURE, URINE COMPREHENSIVE   Specimen: Urine   UR  Result Value Ref Range   Urine Culture, Comprehensive Final report (A)    Organism ID, Bacteria Citrobacter koseri (A)    ANTIMICROBIAL SUSCEPTIBILITY Comment   POCT Urinalysis Dipstick  Result Value Ref Range   Color, UA     Clarity, UA     Glucose,  UA Negative Negative   Bilirubin, UA Negative    Ketones, UA Negative    Spec Grav, UA 1.025 1.010 - 1.025   Blood, UA Large    pH, UA 5.0 5.0 - 8.0   Protein, UA Negative Negative   Urobilinogen, UA 0.2 0.2 or 1.0 E.U./dL   Nitrite, UA Positive    Leukocytes, UA Moderate (2+) (A) Negative   Appearance     Odor         Assessment & Plan    1. Cystitis Worsening symptoms. UA positive. Will treat empirically with Bactrim as below. Continue to push fluids. Urine sent for culture. Will follow up pending C&S results. She is to call if symptoms do not improve or if they worsen.  - CULTURE, URINE COMPREHENSIVE - sulfamethoxazole-trimethoprim (BACTRIM DS) 800-160 MG tablet; Take 1 tablet by mouth 2 (two) times daily.  Dispense: 14 tablet; Refill: 0  2. HYPERLIPIDEMIA, MIXED Stable. Diagnosis pulled for medication refill. Continue current medical treatment plan. - atorvastatin (LIPITOR) 10 MG tablet; Take 1 tablet (10 mg total) by mouth daily.  Dispense: 90 tablet; Refill: 3  3. Class 1 obesity due to excess calories with serious comorbidity and body mass index (BMI) of 30.0 to 30.9 in adult Counseled patient on healthy lifestyle modifications including dieting and exercise.      Mar Daring, PA-C  [facility]

## 2020-01-08 LAB — CULTURE, URINE COMPREHENSIVE

## 2020-01-09 ENCOUNTER — Telehealth: Payer: Self-pay

## 2020-01-09 NOTE — Telephone Encounter (Signed)
LMTCB

## 2020-01-09 NOTE — Telephone Encounter (Signed)
-----   Message from Mar Daring, PA-C sent at 01/09/2020  7:44 AM EST ----- Urine culture was positive for bacteria. It is sensitive to the bactrim antibiotic you were placed on. Continue until completed and call if not completely resolved.

## 2020-01-12 ENCOUNTER — Encounter: Payer: Self-pay | Admitting: Adult Health

## 2020-01-12 ENCOUNTER — Other Ambulatory Visit: Payer: Self-pay

## 2020-01-12 ENCOUNTER — Ambulatory Visit: Payer: Managed Care, Other (non HMO) | Admitting: Adult Health

## 2020-01-12 VITALS — BP 136/76 | HR 74 | Temp 98.2°F | Resp 16 | Wt 172.6 lb

## 2020-01-12 DIAGNOSIS — Z87440 Personal history of urinary (tract) infections: Secondary | ICD-10-CM

## 2020-01-12 DIAGNOSIS — S39012A Strain of muscle, fascia and tendon of lower back, initial encounter: Secondary | ICD-10-CM | POA: Diagnosis not present

## 2020-01-12 MED ORDER — CYCLOBENZAPRINE HCL 10 MG PO TABS: 10.0000 mg | ORAL_TABLET | Freq: Three times a day (TID) | ORAL | 0 refills | Status: DC | PRN

## 2020-01-12 MED ORDER — PREDNISONE 10 MG (21) PO TBPK: ORAL_TABLET | ORAL | 0 refills | Status: DC

## 2020-01-12 NOTE — Patient Instructions (Signed)
Muscle Cramps and Spasms Muscle cramps and spasms are when muscles tighten by themselves. They usually get better within minutes. Muscle cramps are painful. They are usually stronger and last longer than muscle spasms. Muscle spasms may or may not be painful. They can last a few seconds or much longer. Cramps and spasms can affect any muscle, but they occur most often in the calf muscles of the leg. They are usually not caused by a serious problem. In many cases, the cause is not known. Some common causes include:  Doing more physical work or exercise than your body is ready for.  Using the muscles too much (overuse) by repeating certain movements too many times.  Staying in a certain position for a long time.  Playing a sport or doing an activity without preparing properly.  Using bad form or technique while playing a sport or doing an activity.  Not having enough water in your body (dehydration).  Injury.  Side effects of some medicines.  Low levels of the salts and minerals in your blood (electrolytes), such as low potassium or calcium. Follow these instructions at home: Managing pain and stiffness      Massage, stretch, and relax the muscle. Do this for many minutes at a time.  If told, put heat on tight or tense muscles as often as told by your doctor. Use the heat source that your doctor recommends, such as a moist heat pack or a heating pad. ? Place a towel between your skin and the heat source. ? Leave the heat on for 20-30 minutes. ? Remove the heat if your skin turns bright red. This is very important if you are not able to feel pain, heat, or cold. You may have a greater risk of getting burned.  If told, put ice on the affected area. This may help if you are sore or have pain after a cramp or spasm. ? Put ice in a plastic bag. ? Place a towel between your skin and the bag. ? Leave the ice on for 20 minutes, 2-3 times a day.  Try taking hot showers or baths to help  relax tight muscles. Eating and drinking  Drink enough fluid to keep your pee (urine) pale yellow.  Eat a healthy diet to help ensure that your muscles work well. This should include: ? Fruits and vegetables. ? Lean protein. ? Whole grains. ? Low-fat or nonfat dairy products. General instructions  If you are having cramps often, avoid intense exercise for several days.  Take over-the-counter and prescription medicines only as told by your doctor.  Watch for any changes in your symptoms.  Keep all follow-up visits as told by your doctor. This is important. Contact a doctor if:  Your cramps or spasms get worse or happen more often.  Your cramps or spasms do not get better with time. Summary  Muscle cramps and spasms are when muscles tighten by themselves. They usually get better within minutes.  Cramps and spasms occur most often in the calf muscles of the leg.  Massage, stretch, and relax the muscle. This may help the cramp or spasm go away.  Drink enough fluid to keep your pee (urine) pale yellow. This information is not intended to replace advice given to you by your health care provider. Make sure you discuss any questions you have with your health care provider. Document Revised: 05/10/2018 Document Reviewed: 05/10/2018 Elsevier Patient Education  2020 [employer]  

## 2020-01-12 NOTE — Progress Notes (Signed)
Patient: Teresa Torres Female    DOB: 07/26/1951   69 y.o.   MRN: [id number] Visit Date: 01/12/2020  Today's Provider: Marcille Buffy, FNP   Chief Complaint  Patient presents with  . Back Pain   Subjective:     Back Pain This is a new problem. The current episode started more than 1 month ago. The problem occurs constantly. The problem has been gradually worsening since onset. The pain is present in the lumbar spine. The quality of the pain is described as shooting (sharp). Radiates to: left leg. The pain is moderate. The pain is the same all the time. The symptoms are aggravated by bending and lying down. Stiffness is present in the morning. Associated symptoms include leg pain and weakness (patient states that weakness in left leg has improved). Pertinent negatives include no abdominal pain, bladder incontinence, bowel incontinence, chest pain, dysuria, fever, headaches, numbness, paresis, paresthesias, pelvic pain, perianal numbness, tingling or weight loss. She has tried NSAIDs for the symptoms. The treatment provided mild relief.   01/05/2020 treated with Bactrim. She finished last antibiotic today. No urinary symptoms.  Left leg pain on Monday and Tuesday she started taking aleve and she has had improvemnet of all leg symptoms as of today can now touch her knee to her shoulder no issue. Feels pulling in lower back with twisting movements, and with getting up. Muscle spasm 29th. Using tens it helps. Not taking Naproxen now.  No known injury.  She denies any urinary symptoms, she feels all this has resolved she has last dose of antibiotic today.  She denies any hematuria.  Patient  denies any fever, body aches,chills, rash, chest pain, shortness of breath, nausea, vomiting, or diarrhea.   Specimen Information: Urine   UR     Component 7 d ago  Urine Culture, Comprehensive Final reportAbnormal    Organism ID, Bacteria Citrobacter [id number]    Comment: Greater than  100,000 colony forming units per mL  ANTIMICROBIAL SUSCEPTIBILITY Comment   Comment:   ** S = Susceptible; I = Intermediate; R = Resistant **           P = Positive; N = Negative        Allergies  Allergen Reactions  . Compazine [Prochlorperazine Edisylate] Anaphylaxis  . Aspirin Nausea And Vomiting  . Nitrofurantoin Nausea And Vomiting     Current Outpatient Medications:  .  atorvastatin (LIPITOR) 10 MG tablet, Take 1 tablet (10 mg total) by mouth daily., Disp: 90 tablet, Rfl: 3 .  naproxen (EC NAPROSYN) 500 MG EC tablet, Take 1 tablet (500 mg total) by mouth 2 (two) times daily with a meal., Disp: 60 tablet, Rfl: 2 .  pantoprazole (PROTONIX) 40 MG tablet, Take 40 mg by mouth daily., Disp: , Rfl:   Review of Systems  Constitutional: Negative for fever and weight loss.  Cardiovascular: Negative for chest pain.  Gastrointestinal: Negative for abdominal pain and bowel incontinence.  Genitourinary: Negative for bladder incontinence, dysuria and pelvic pain.  Musculoskeletal: Positive for back pain.  Neurological: Positive for weakness (patient states that weakness in left leg has improved). Negative for tingling, numbness, headaches and paresthesias.    Social History   Tobacco Use  . Smoking status: Former Research scientist (life sciences)  . Smokeless tobacco: Never Used  . Tobacco comment: smoked cigarettes about 1 pack per day; quit in 2007, smoked fro 20 years  Substance Use Topics  . Alcohol use: Yes    Comment: Drinks  wine; Occasional alcohol use      Objective:   BP 136/76   Pulse 74   Temp 98.2 F (36.8 C) (Oral)   Resp 16   Wt 172 lb 9.6 oz (78.3 kg)   SpO2 99%   BMI 29.63 kg/m  Vitals:   01/12/20 0850  BP: 136/76  Pulse: 74  Resp: 16  Temp: 98.2 F (36.8 C)  TempSrc: Oral  SpO2: 99%  Weight: 172 lb 9.6 oz (78.3 kg)  Body mass index is 29.63 kg/m.   Physical Exam Constitutional:      General: She is not in acute distress.    Appearance: Normal  appearance. She is not ill-appearing, toxic-appearing or diaphoretic.  HENT:     Head: Normocephalic and atraumatic.     Mouth/Throat:     Mouth: Mucous membranes are moist.     Pharynx: No oropharyngeal exudate or posterior oropharyngeal erythema.  Eyes:     Pupils: Pupils are equal, round, and reactive to light.  Cardiovascular:     Rate and Rhythm: Normal rate and regular rhythm.     Pulses: Normal pulses.     Heart sounds: Normal heart sounds. No murmur. No friction rub. No gallop.   Pulmonary:     Effort: Pulmonary effort is normal.     Breath sounds: Normal breath sounds.  Abdominal:     General: There is no distension.     Palpations: Abdomen is soft. There is no mass.     Tenderness: There is no abdominal tenderness. There is no right CVA tenderness, left CVA tenderness, guarding or rebound.     Hernia: No hernia is present.  Musculoskeletal:        General: Tenderness present. No swelling, deformity or signs of injury.     Cervical back: Normal, normal range of motion and neck supple. No rigidity or tenderness.     Thoracic back: Normal. No tenderness or bony tenderness.     Lumbar back: Spasms and tenderness present. No swelling, deformity or bony tenderness. Normal range of motion. Negative right straight leg raise test and negative left straight leg raise test.       Back:     Right lower leg: No edema.     Left lower leg: No edema.     Comments: Spasm and tenderness muscular bilateral lumbar sacral.  Skin is normal no rash or zoster.  Lymphadenopathy:     Cervical: No cervical adenopathy.  Skin:    General: Skin is warm.     Capillary Refill: Capillary refill takes less than 2 seconds.  Neurological:     General: No focal deficit present.     Mental Status: She is alert.  Psychiatric:        Mood and Affect: Mood normal.        Behavior: Behavior normal.        Thought Content: Thought content normal.        Judgment: Judgment normal.      No results found  for any visits on 01/12/20.     Assessment & Plan    1. Strain of lumbar paraspinal muscle, initial encounter Meds ordered this encounter  Medications  . cyclobenzaprine (FLEXERIL) 10 MG tablet    Sig: Take 1 tablet (10 mg total) by mouth 3 (three) times daily as needed for muscle spasms (causes  drowsiness.).    Dispense:  30 tablet    Refill:  0  . predniSONE (STERAPRED UNI-PAK 21 TAB) 10 MG (  21) TBPK tablet    Sig: PO: Take 6 tablets on day 1:Take 5 tablets day 2:Take 4 tablets day 3: Take 3 tablets day 4:Take 2 tablets day five: 5 Take 1 tablet day 6    Dispense:  21 tablet    Refill:  0   NO NSAID'S with prednisone. Discussed Tylenol per package for pain.   2. History of cystitis Declined urine. Symptoms have resolved.  She will return if symptoms return after antibiotic completion. May return with urine sample if needed since I just evaluated her unless symptoms are returning or worsening.   Return if symptoms worsen or fail to improve, for at any time for any worsening symptoms, Go to Emergency room/ urgent care if worse.  Advised patient call the office or your primary care doctor for an appointment if no improvement within 72 hours or if any symptoms change or worsen at any time  Advised ER or urgent Care if after hours or on weekend. Call 911 for emergency symptoms at any time.Patinet verbalized understanding of all instructions given/reviewed and treatment plan and has no further questions or concerns at this time.    The entirety of the information documented in the History of Present Illness, Review of Systems and Physical Exam were personally obtained by me. Portions of this information were initially documented by the  Certified Medical Assistant whose name is documented in [place] and reviewed by me for thoroughness and accuracy.  I have personally performed the exam and reviewed the chart and it is accurate to the best of my knowledge.  Haematologist has been used and  any errors in dictation or transcription are unintentional.  Kelby Aline. [place], [facility]

## 2020-01-13 DIAGNOSIS — Z87440 Personal history of urinary (tract) infections: Secondary | ICD-10-CM | POA: Insufficient documentation

## 2020-01-13 DIAGNOSIS — S39012A Strain of muscle, fascia and tendon of lower back, initial encounter: Secondary | ICD-10-CM | POA: Insufficient documentation

## 2020-02-24 ENCOUNTER — Ambulatory Visit: Payer: Managed Care, Other (non HMO) | Attending: Internal Medicine

## 2020-02-24 DIAGNOSIS — Z23 Encounter for immunization: Secondary | ICD-10-CM

## 2020-02-24 NOTE — Progress Notes (Signed)
   Covid-19 Vaccination Clinic  Name:  Teresa Torres    MRN: [id number] DOB: 1951/07/24  02/24/2020  Teresa Torres was observed post Covid-19 immunization for 15 minutes without incidence. She was provided with Vaccine Information Sheet and instruction to access the V-Safe system.   Teresa Torres was instructed to call 911 with any severe reactions post vaccine: Marland Kitchen Difficulty breathing  . Swelling of your face and throat  . A fast heartbeat  . A bad rash all over your body  . Dizziness and weakness    Immunizations Administered    Name Date Dose VIS Date Route   Pfizer COVID-19 Vaccine 02/24/2020 11:46 AM 0.3 mL 12/09/2019 Intramuscular   Manufacturer: [place]   Lot: [id number]   [place]: [id number]

## 2020-03-21 ENCOUNTER — Ambulatory Visit: Payer: Managed Care, Other (non HMO) | Attending: Internal Medicine

## 2020-03-21 DIAGNOSIS — Z23 Encounter for immunization: Secondary | ICD-10-CM

## 2020-03-21 NOTE — Progress Notes (Signed)
   Covid-19 Vaccination Clinic  Name:  Teresa Torres    MRN: [id number] DOB: 1951-04-24  03/21/2020  Ms. Laudano was observed post Covid-19 immunization for 15 minutes without incident. She was provided with Vaccine Information Sheet and instruction to access the V-Safe system.   Ms. Mirante was instructed to call 911 with any severe reactions post vaccine: Marland Kitchen Difficulty breathing  . Swelling of face and throat  . A fast heartbeat  . A bad rash all over body  . Dizziness and weakness   Immunizations Administered    Name Date Dose VIS Date Route   Pfizer COVID-19 Vaccine 03/21/2020  4:43 PM 0.3 mL 12/09/2019 Intramuscular   Manufacturer: [place]   Lot: [id number]   [place]: [id number]

## 2020-06-13 NOTE — Progress Notes (Signed)
Established patient visit   Patient: Teresa Torres   DOB: 20-Jun-1951   69 y.o. Female  MRN: [id number] Visit Date: 06/15/2020  Today's healthcare provider: Vernie Murders, PA   Chief Complaint  Patient presents with  . Sore Throat   Subjective    Sore Throat  This is a new problem. The current episode started 1 to 4 weeks ago. The problem has been waxing and waning. The pain is worse on the right side. There has been no fever. Associated symptoms include ear pain and swollen glands. Pertinent negatives include no headaches, hoarse voice, shortness of breath or trouble swallowing. She has tried acetaminophen (nasal spray) for the symptoms. The treatment provided mild relief.    Past Medical History:  Diagnosis Date  . Arthritis    of spine  . Hiatal hernia   . Hyperlipidemia    Past Surgical History:  Procedure Laterality Date  . ABDOMINAL HYSTERECTOMY  1984   Partial  . BREAST BIOPSY Right 1972   EXCISIONAL - NEG  . CHOLECYSTECTOMY  2007-2008  . KNEE SURGERY  1980  . TONSILLECTOMY  1961   Social History   Tobacco Use  . Smoking status: Former Research scientist (life sciences)  . Smokeless tobacco: Never Used  . Tobacco comment: smoked cigarettes about 1 pack per day; quit in 2007, smoked fro 20 years  Substance Use Topics  . Alcohol use: Yes    Comment: Drinks wine; Occasional alcohol use  . Drug use: No   Family Status  Relation Name Status  . Mother  Deceased at age 18       Heart Disease  . Father  Deceased at age 69       Died from a motor vehicle  . Brother  Deceased       died from a motor vehicle accident  . Son  Alive       strokes  . PGM  (Not Specified)   Allergies  Allergen Reactions  . Compazine [Prochlorperazine Edisylate] Anaphylaxis  . Aspirin Nausea And Vomiting  . Nitrofurantoin Nausea And Vomiting     Medications: Outpatient Medications Prior to Visit  Medication Sig  . atorvastatin (LIPITOR) 10 MG tablet Take 1 tablet (10 mg total) by mouth daily.  .  cyclobenzaprine (FLEXERIL) 10 MG tablet Take 1 tablet (10 mg total) by mouth 3 (three) times daily as needed for muscle spasms (causes  drowsiness.).  Marland Kitchen naproxen (EC NAPROSYN) 500 MG EC tablet Take 1 tablet (500 mg total) by mouth 2 (two) times daily with a meal.  . pantoprazole (PROTONIX) 40 MG tablet Take 40 mg by mouth daily.  . predniSONE (STERAPRED UNI-PAK 21 TAB) 10 MG (21) TBPK tablet PO: Take 6 tablets on day 1:Take 5 tablets day 2:Take 4 tablets day 3: Take 3 tablets day 4:Take 2 tablets day five: 5 Take 1 tablet day 6 (Patient not taking: Reported on 06/15/2020)   No facility-administered medications prior to visit.    Review of Systems  HENT: Positive for ear pain. Negative for hoarse voice and trouble swallowing.   Respiratory: Negative for shortness of breath.   Neurological: Negative for headaches.    Objective    BP (!) 156/79 (BP Location: Left Arm, Patient Position: Sitting, Cuff Size: Large)   Pulse 69   Temp (!) 96.9 F (36.1 C) (Temporal)   Ht 5\' 3"  (1.6 m)   Wt 174 lb 9.6 oz (79.2 kg)   BMI 30.93 kg/m    Physical Exam  Constitutional:      General: She is not in acute distress.    Appearance: She is well-developed.  HENT:     Head: Normocephalic and atraumatic.     Right Ear: Hearing and tympanic membrane normal.     Left Ear: Hearing and tympanic membrane normal.     Nose: Nose normal.     Mouth/Throat:     Mouth: Mucous membranes are moist.     Tonsils: No tonsillar exudate or tonsillar abscesses.  Eyes:     General: Lids are normal. No scleral icterus.       Right eye: No discharge.        Left eye: No discharge.     Conjunctiva/sclera: Conjunctivae normal.  Cardiovascular:     Rate and Rhythm: Regular rhythm.     Heart sounds: Normal heart sounds.  Pulmonary:     Effort: Pulmonary effort is normal. No respiratory distress.  Abdominal:     General: Bowel sounds are normal.     Palpations: Abdomen is soft.  Musculoskeletal:        General:  Normal range of motion.     Cervical back: Normal range of motion and neck supple.  Skin:    Findings: No lesion or rash.  Neurological:     Mental Status: She is alert and oriented to person, place, and time.  Psychiatric:        Speech: Speech normal.        Behavior: Behavior normal.        Thought Content: Thought content normal.     No results found for any visits on 06/15/20.  Assessment & Plan     1. Sore throat Developed a discomfort and occasional burning in throat over the past month. No fever, PND, congestion, cough or earache. Continues to use an antihistamine or Flonase prn any allergy symptoms. Protonix not helping with reflux symptoms as well as the Dexilant did. Will schedule follow up with GI and get labs to rule out infection or thyroiditis.  - Ambulatory referral to Gastroenterology - CBC with Differential/Platelet - TSH  2. Gastroesophageal reflux disease, unspecified whether esophagitis present Insurance stopped covering the [place] that work so well to control reflux symptoms. Was switched to Protonix, but, it does not seem to help much. Her gastroenterologist (Dr. Oletta Lamas in [place]) retired. She would like to get established with a local GI. May add Famotidine prn and will check routine labs. Denies hematemesis or melena. - Ambulatory referral to Gastroenterology - CBC with Differential/Platelet - Comprehensive metabolic panel  3. HYPERLIPIDEMIA, MIXED Tolerating the Lipitor 10 mg qd and trying to follow a low fat diet. Recheck CMP. Lipid Panel and TSH. Continue present regimen. - Comprehensive metabolic panel - Lipid panel - TSH   No follow-ups on file.      Andres Shad, PA, have reviewed all documentation for this visit. The documentation on 06/15/20 for the exam, diagnosis, procedures, and orders are all accurate and complete.    Vernie Murders, [place] [phone] (phone) [phone] (fax)  [place]

## 2020-06-15 ENCOUNTER — Other Ambulatory Visit: Payer: Self-pay

## 2020-06-15 ENCOUNTER — Encounter: Payer: Self-pay | Admitting: Family Medicine

## 2020-06-15 ENCOUNTER — Ambulatory Visit: Payer: Managed Care, Other (non HMO) | Admitting: Family Medicine

## 2020-06-15 VITALS — BP 156/79 | HR 69 | Temp 96.9°F | Ht 63.0 in | Wt 174.6 lb

## 2020-06-15 DIAGNOSIS — E782 Mixed hyperlipidemia: Secondary | ICD-10-CM

## 2020-06-15 DIAGNOSIS — K219 Gastro-esophageal reflux disease without esophagitis: Secondary | ICD-10-CM

## 2020-06-15 DIAGNOSIS — J029 Acute pharyngitis, unspecified: Secondary | ICD-10-CM

## 2020-06-16 LAB — CBC WITH DIFFERENTIAL/PLATELET
Basophils Absolute: 0.1 10*3/uL (ref 0.0–0.2)
Basos: 1 %
EOS (ABSOLUTE): 0.2 10*3/uL (ref 0.0–0.4)
Eos: 3 %
Hematocrit: 47.1 % — ABNORMAL HIGH (ref 34.0–46.6)
Hemoglobin: 15.3 g/dL (ref 11.1–15.9)
Immature Grans (Abs): 0 10*3/uL (ref 0.0–0.1)
Immature Granulocytes: 1 %
Lymphocytes Absolute: 3.1 10*3/uL (ref 0.7–3.1)
Lymphs: 41 %
MCH: 29.1 pg (ref 26.6–33.0)
MCHC: 32.5 g/dL (ref 31.5–35.7)
MCV: 90 fL (ref 79–97)
Monocytes Absolute: 0.5 10*3/uL (ref 0.1–0.9)
Monocytes: 6 %
Neutrophils Absolute: 3.7 10*3/uL (ref 1.4–7.0)
Neutrophils: 48 %
Platelets: 347 10*3/uL (ref 150–450)
RBC: 5.26 x10E6/uL (ref 3.77–5.28)
RDW: 13.1 % (ref 11.7–15.4)
WBC: 7.6 10*3/uL (ref 3.4–10.8)

## 2020-06-16 LAB — COMPREHENSIVE METABOLIC PANEL
ALT: 20 IU/L (ref 0–32)
AST: 17 IU/L (ref 0–40)
Albumin/Globulin Ratio: 1.9 (ref 1.2–2.2)
Albumin: 4.3 g/dL (ref 3.8–4.8)
Alkaline Phosphatase: 66 IU/L (ref 48–121)
BUN/Creatinine Ratio: 14 (ref 12–28)
BUN: 13 mg/dL (ref 8–27)
Bilirubin Total: 0.5 mg/dL (ref 0.0–1.2)
CO2: 26 mmol/L (ref 20–29)
Calcium: 9.9 mg/dL (ref 8.7–10.3)
Chloride: 105 mmol/L (ref 96–106)
Creatinine, Ser: 0.92 mg/dL (ref 0.57–1.00)
GFR calc Af Amer: 75 mL/min/{1.73_m2} (ref >59)
GFR calc non Af Amer: 65 mL/min/{1.73_m2} (ref >59)
Globulin, Total: 2.3 g/dL (ref 1.5–4.5)
Glucose: 94 mg/dL (ref 65–99)
Potassium: 4.4 mmol/L (ref 3.5–5.2)
Sodium: 142 mmol/L (ref 134–144)
Total Protein: 6.6 g/dL (ref 6.0–8.5)

## 2020-06-16 LAB — LIPID PANEL
Chol/HDL Ratio: 3.1 ratio (ref 0.0–4.4)
Cholesterol, Total: 196 mg/dL (ref 100–199)
HDL: 64 mg/dL (ref >39)
LDL Chol Calc (NIH): 107 mg/dL — ABNORMAL HIGH (ref 0–99)
Triglycerides: 144 mg/dL (ref 0–149)
VLDL Cholesterol Cal: 25 mg/dL (ref 5–40)

## 2020-06-16 LAB — TSH: TSH: 2.67 u[IU]/mL (ref 0.450–4.500)

## 2020-08-21 ENCOUNTER — Encounter: Payer: Self-pay | Admitting: Gastroenterology

## 2020-08-21 ENCOUNTER — Ambulatory Visit: Payer: Managed Care, Other (non HMO) | Admitting: Gastroenterology

## 2020-08-21 ENCOUNTER — Other Ambulatory Visit: Payer: Self-pay

## 2020-08-21 VITALS — BP 154/78 | HR 74 | Ht 63.0 in | Wt 174.8 lb

## 2020-08-21 DIAGNOSIS — K219 Gastro-esophageal reflux disease without esophagitis: Secondary | ICD-10-CM | POA: Diagnosis not present

## 2020-08-21 NOTE — Progress Notes (Signed)
Gastroenterology Consultation  Referring Provider:     Margo Common, PA Primary Care Physician:  Margo Common, PA Primary Gastroenterologist:  Dr. Allen Norris     Reason for Consultation:     GERD        HPI:   Teresa Torres is a 69 y.o. y/o female referred for consultation & management of GERD by Dr. Natale Milch, Vickki Muff, PA.  This patient comes in today after having a gastroenterologist in the past in [place] who has retired.  The patient states she went to the dentist and was having her neck extended for a long period of time and started to have some discomfort in her neck that she thought was a muscle strain.  The patient later attributed to reflux.  She states that she had been on Nexium twice a day and then moved over to [place].  She states the [place] worked very well for her but with switch to Protonix once a day because her new insurance company did not cover the [employer]. The patient denies any unexplained weight loss fevers chills nausea vomiting black stools or bloody stools.  She reports that when she started having these episodes that they were much more bothersome but with each episode she feels like the symptoms are getting better.  They just have not completely resolved.  Past Medical History:  Diagnosis Date  . Arthritis    of spine  . Hiatal hernia   . Hyperlipidemia     Past Surgical History:  Procedure Laterality Date  . ABDOMINAL HYSTERECTOMY  1984   Partial  . BREAST BIOPSY Right 1972   EXCISIONAL - NEG  . CHOLECYSTECTOMY  2007-2008  . KNEE SURGERY  1980  . TONSILLECTOMY  1961    Prior to Admission medications   Medication Sig Start Date End Date Taking? Authorizing Provider  atorvastatin (LIPITOR) 10 MG tablet Take 1 tablet (10 mg total) by mouth daily. 01/05/20  Yes Mar Daring, PA-C  cyclobenzaprine (FLEXERIL) 10 MG tablet Take 1 tablet (10 mg total) by mouth 3 (three) times daily as needed for muscle spasms (causes  drowsiness.).  01/12/20  Yes Flinchum, Kelby Aline, FNP  naproxen (EC NAPROSYN) 500 MG EC tablet Take 1 tablet (500 mg total) by mouth 2 (two) times daily with a meal. 08/13/15  Yes Chrismon, Vickki Muff, PA  pantoprazole (PROTONIX) 40 MG tablet Take 40 mg by mouth daily.   Yes [provider]  predniSONE (STERAPRED UNI-PAK 21 TAB) 10 MG (21) TBPK tablet PO: Take 6 tablets on day 1:Take 5 tablets day 2:Take 4 tablets day 3: Take 3 tablets day 4:Take 2 tablets day five: 5 Take 1 tablet day 6 Patient not taking: Reported on 06/15/2020 01/12/20   Flinchum, Kelby Aline, FNP    Family History  Problem Relation Age of Onset  . Breast cancer Paternal Grandmother      Social History   Tobacco Use  . Smoking status: Former Research scientist (life sciences)  . Smokeless tobacco: Never Used  . Tobacco comment: smoked cigarettes about 1 pack per day; quit in 2007, smoked fro 20 years  Substance Use Topics  . Alcohol use: Yes    Comment: Drinks wine; Occasional alcohol use  . Drug use: No    Allergies as of 08/21/2020 - Review Complete 08/21/2020  Allergen Reaction Noted  . Compazine [prochlorperazine edisylate] Anaphylaxis 08/13/2015  . Aspirin Nausea And Vomiting 08/13/2015  . Nitrofurantoin Nausea And Vomiting 12/15/2016    Review of Systems:  All systems reviewed and negative except where noted in HPI.   Physical Exam:  BP (!) 154/78   Pulse 74   Ht 5\' 3"  (1.6 m)   Wt 174 lb 12.8 oz (79.3 kg)   BMI 30.96 kg/m  No LMP recorded. Patient has had a hysterectomy. General:   Alert,  Well-developed, well-nourished, pleasant and cooperative in NAD Head:  Normocephalic and atraumatic. Eyes:  Sclera clear, no icterus.   Conjunctiva pink. Ears:  Normal auditory acuity. Neck:  Supple; no masses or thyromegaly. Lungs:  Respirations even and unlabored.  Clear throughout to auscultation.   No wheezes, crackles, or rhonchi. No acute distress. Heart:  Regular rate and rhythm; no murmurs, clicks, rubs, or gallops. Abdomen:  Normal  bowel sounds.  No bruits.  Soft, non-tender and non-distended without masses, hepatosplenomegaly or hernias noted.  No guarding or rebound tenderness.  Negative Carnett sign.   Rectal:  Deferred.  Pulses:  Normal pulses noted. Extremities:  No clubbing or edema.  No cyanosis. Neurologic:  Alert and oriented x3;  grossly normal neurologically. Skin:  Intact without significant lesions or rashes.  No jaundice. Lymph Nodes:  No significant cervical adenopathy. Psych:  Alert and cooperative. Normal mood and affect.  Imaging Studies: No results found.  Assessment and Plan:   Teresa Torres is a 69 y.o. y/o female who comes in today with a history of reflux and now she is on Protonix.  She feels like she is having worsening GERD symptoms and was not feeling as well as she did when she was on Dexilant.  The patient has been given 25 days of Dexilant samples to see if this helps her symptoms.  If it does not improve her symptoms she may need an upper endoscopy.  If it does improve her symptoms then we will consider giving her a PPI twice daily versus petitioning her insurance company to cover the medication.  The patient has been explained the plan and agrees with it.    Lucilla Lame, MD. [facility]al    Note: This dictation was prepared with Dragon dictation along with smaller phrase technology. Any transcriptional errors that result from this process are unintentional.

## 2020-09-13 ENCOUNTER — Other Ambulatory Visit: Payer: Self-pay

## 2020-09-13 MED ORDER — PANTOPRAZOLE SODIUM 40 MG PO TBEC: 40.0000 mg | DELAYED_RELEASE_TABLET | Freq: Every day | ORAL | 6 refills | Status: DC

## 2020-09-14 ENCOUNTER — Ambulatory Visit: Payer: Managed Care, Other (non HMO) | Admitting: Physician Assistant

## 2020-09-14 ENCOUNTER — Encounter: Payer: Self-pay | Admitting: Physician Assistant

## 2020-09-14 ENCOUNTER — Other Ambulatory Visit: Payer: Self-pay

## 2020-09-14 VITALS — BP 148/76 | HR 75 | Temp 98.2°F | Resp 16 | Wt 173.2 lb

## 2020-09-14 DIAGNOSIS — Z23 Encounter for immunization: Secondary | ICD-10-CM | POA: Diagnosis not present

## 2020-09-14 DIAGNOSIS — L237 Allergic contact dermatitis due to plants, except food: Secondary | ICD-10-CM

## 2020-09-14 MED ORDER — TRIAMCINOLONE ACETONIDE 0.1 % EX CREA: 1.0000 "application " | TOPICAL_CREAM | Freq: Two times a day (BID) | CUTANEOUS | 0 refills | Status: DC

## 2020-09-14 MED ORDER — PREDNISONE 10 MG (21) PO TBPK: ORAL_TABLET | ORAL | 0 refills | Status: DC

## 2020-09-14 NOTE — Progress Notes (Signed)
Established patient visit   Patient: Teresa Torres   DOB: 1951-09-15   69 y.o. Female  MRN: [id number] Visit Date: 09/14/2020  Today's healthcare provider: Mar Daring, PA-C   Chief Complaint  Patient presents with  . Rash   Subjective    Rash This is a new problem. The current episode started 1 to 4 weeks ago (2 weeks ago). The affected locations include the left arm, chest and abdomen. The rash is characterized by itchiness and redness. It is unknown if there was an exposure to a precipitant. Pertinent negatives include no congestion, cough, diarrhea, facial edema, fatigue, joint pain, rhinorrhea, shortness of breath, sore throat or vomiting. Treatments tried: Alvaric. The treatment provided no relief.    Was working outside and picking up sticks. Had seen some poison oak but did not touch it, but oils could have been on the sticks.   Patient Active Problem List   Diagnosis Date Noted  . History of cystitis 01/13/2020  . Strain of lumbar paraspinal muscle, initial encounter 01/13/2020  . Arthritis of spine 01/21/2016  . Breathlessness on exertion 10/30/2015  . Benign essential HTN 07/30/2015  . Arteriosclerosis of coronary artery 11/17/2014  . Acid reflux 11/17/2014  . Awareness of heartbeats 11/17/2014  . HYPERLIPIDEMIA, MIXED 09/03/2009  . ACID REFLUX DISEASE 09/03/2009   Past Medical History:  Diagnosis Date  . Arthritis    of spine  . Hiatal hernia   . Hyperlipidemia        Medications: Outpatient Medications Prior to Visit  Medication Sig  . atorvastatin (LIPITOR) 10 MG tablet Take 1 tablet (10 mg total) by mouth daily.  . cyclobenzaprine (FLEXERIL) 10 MG tablet Take 1 tablet (10 mg total) by mouth 3 (three) times daily as needed for muscle spasms (causes  drowsiness.).  Marland Kitchen naproxen (EC NAPROSYN) 500 MG EC tablet Take 1 tablet (500 mg total) by mouth 2 (two) times daily with a meal.  . pantoprazole (PROTONIX) 40 MG tablet Take 1 tablet (40 mg total) by  mouth daily. (Patient taking differently: Take 40 mg by mouth 2 (two) times daily. )  . predniSONE (STERAPRED UNI-PAK 21 TAB) 10 MG (21) TBPK tablet PO: Take 6 tablets on day 1:Take 5 tablets day 2:Take 4 tablets day 3: Take 3 tablets day 4:Take 2 tablets day five: 5 Take 1 tablet day 6 (Patient not taking: Reported on 06/15/2020)   No facility-administered medications prior to visit.    Review of Systems  Constitutional: Negative for fatigue.  HENT: Negative for congestion, rhinorrhea and sore throat.   Respiratory: Negative for cough and shortness of breath.   Gastrointestinal: Negative for diarrhea and vomiting.  Musculoskeletal: Negative for joint pain.  Skin: Positive for rash.    Last CBC Lab Results  Component Value Date   WBC 7.6 06/15/2020   HGB 15.3 06/15/2020   HCT 47.1 (H) 06/15/2020   MCV 90 06/15/2020   MCH 29.1 06/15/2020   RDW 13.1 06/15/2020   PLT 347 54/62/7035   Last metabolic panel Lab Results  Component Value Date   GLUCOSE 94 06/15/2020   NA 142 06/15/2020   K 4.4 06/15/2020   CL 105 06/15/2020   CO2 26 06/15/2020   BUN 13 06/15/2020   CREATININE 0.92 06/15/2020   GFRNONAA 65 06/15/2020   GFRAA 75 06/15/2020   CALCIUM 9.9 06/15/2020   PROT 6.6 06/15/2020   ALBUMIN 4.3 06/15/2020   LABGLOB 2.3 06/15/2020   AGRATIO 1.9 06/15/2020  BILITOT 0.5 06/15/2020   ALKPHOS 66 06/15/2020   AST 17 06/15/2020   ALT 20 06/15/2020   ANIONGAP 7 09/02/2013      Objective    BP (!) 148/76 (BP Location: Left Arm, Patient Position: Sitting, Cuff Size: Large)   Pulse 75   Temp 98.2 F (36.8 C) (Oral)   Resp 16   Wt 173 lb 3.2 oz (78.6 kg)   SpO2 98%   BMI 30.68 kg/m  BP Readings from Last 3 Encounters:  09/14/20 (!) 148/76  08/21/20 (!) 154/78  06/15/20 (!) 156/79   Wt Readings from Last 3 Encounters:  09/14/20 173 lb 3.2 oz (78.6 kg)  08/21/20 174 lb 12.8 oz (79.3 kg)  06/15/20 174 lb 9.6 oz (79.2 kg)      Physical Exam Vitals reviewed.    Constitutional:      General: She is not in acute distress.    Appearance: Normal appearance. She is well-developed. She is not ill-appearing.  HENT:     Head: Normocephalic and atraumatic.  Pulmonary:     Effort: Pulmonary effort is normal. No respiratory distress.  Musculoskeletal:     Cervical back: Normal range of motion and neck supple.  Skin:    Findings: Rash present. Rash is vesicular.       Neurological:     Mental Status: She is alert.  Psychiatric:        Mood and Affect: Mood normal.        Behavior: Behavior normal.        Thought Content: Thought content normal.        Judgment: Judgment normal.       No results found for any visits on 09/14/20.  Assessment & Plan     1. Need for influenza vaccination Flu vaccine given today without complication. Patient sat upright for 15 minutes to check for adverse reaction before being released. - Flu Vaccine QUAD High Dose(Fluad)  2. 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 Will treat with prednisone taper as below and topical triamcinolone cream for itching. Call if worsening or not resolving.  - predniSONE (STERAPRED UNI-PAK 21 TAB) 10 MG (21) TBPK tablet; 6 day taper; take as directed on package instructions  Dispense: 21 tablet; Refill: 0 - triamcinolone cream (KENALOG) 0.1 %; Apply 1 application topically 2 (two) times daily.  Dispense: 30 g; Refill: 0   No follow-ups on file.      Reynolds Bowl, PA-C, have reviewed all documentation for this visit. The documentation on 09/14/20 for the exam, diagnosis, procedures, and orders are all accurate and complete.   Rubye Beach  [facility] [phone] (phone) [phone] (fax)  [place]

## 2020-09-14 NOTE — Patient Instructions (Signed)
Poison Oak Dermatitis  Poison oak dermatitis is redness and soreness (inflammation) of the skin caused by chemicals in the leaves of the poison oak plant. You may have very bad itching, swelling, a rash, and blisters. What are the causes? You may get this condition by:  Touching a poison oak plant.  Touching something that has the chemical from the leaves on it. This may include animals or objects that have come in contact with the plant. What increases the risk? You are more likely to get this condition if you:  Go outdoors often in wooded or marshy areas.  Go outdoors without wearing protective clothing, such as closed shoes, long pants, and a long-sleeved shirt. What are the signs or symptoms? Symptoms of this condition include:  Redness of the skin.  Very bad itching.  A rash that often includes bumps and blisters. ? The rash usually appears 48 hours after exposure if you have been exposed before. ? If this is the first time you have been exposed, the rash may not appear until a week after exposure.  Swelling. This may occur if the reaction is very bad. Symptoms often clear up in 1-2 weeks. The first time you develop this condition, symptoms may last 3-4 weeks. How is this treated? This condition may be treated with:  Hydrocortisone creams or calamine lotions to help with itching.  Oatmeal baths to soothe the skin.  Medicines to help reduce itching (antihistamines). If you have a very bad reaction, you may also be given steroid medicines. Follow these instructions at home: Medicines  Take or apply over-the-counter and prescription medicines only as told by your doctor.  Use hydrocortisone creams or calamine lotion as needed to help with itching. General instructions  Do not scratch or rub your skin.  Put a cold, wet cloth (cold compress) on the affected areas or take baths in cool water. This will help with itching.  Avoid hot baths and showers.  Take oatmeal  baths as needed. Use colloidal oatmeal. You can get this at a pharmacy or grocery store. Follow the instructions on the package.  While you have the rash, wash your clothes right after you wear them.  Keep all follow-up visits as told by your doctor. This is important. How is this prevented?   Know what poison oak looks like so you can avoid it. ? This plant has three leaves with flowering branches on a single stem. ? The leaves are fuzzy. ? The edges of the leaves look like teeth.  If you have touched poison oak, wash your skin with soap and water right away. Be sure to wash under your fingernails.  When hiking or camping, wear long pants, a long-sleeved shirt, tall socks, and hiking boots. You can also use a lotion on your skin that helps to prevent contact with the chemical on the plant.  If you think that your clothes or outdoor gear came in contact with poison oak, rinse them off with a garden hose before you bring them inside your house.  When doing yard work or gardening, wear gloves, long sleeves, long pants, and boots. Wash your garden tools and gloves if they come in contact with poison oak.  If you think that your pet has come into contact with poison oak, wash him or her with pet shampoo and water. Make sure you wear gloves while washing your pet.  Do not burn poison oak plants. This can release the chemical from the plant into the air and   may cause a reaction. Contact a doctor if:  You have open sores in the rash area.  You have more redness, swelling, or pain in the affected area.  You have redness that spreads beyond the rash area.  You have fluid, blood, or pus coming from the affected area.  You have a fever.  You have a rash over a large area of your body.  You have a rash on your eyes, mouth, or genitals.  Your rash does not improve after a few weeks. Get help right away if:  Your face swells or your eyes swell shut.  You have trouble breathing.  You  have trouble swallowing. These symptoms may be an emergency. Do not wait to see if the symptoms will go away. Get medical help right away. Call your local emergency services (911 in the [place] Do not drive yourself to the hospital. Summary  Poison oak dermatitis is redness and soreness of the skin caused by chemicals in the leaves of the poison oak plant.  Symptoms of this condition include redness, very bad itching, a rash, and swelling.  Do not scratch or rub your skin.  Take or apply over-the-counter and prescription medicines only as told by your doctor. This information is not intended to replace advice given to you by your health care provider. Make sure you discuss any questions you have with your health care provider. Document Revised: 04/08/2019 Document Reviewed: 01/14/2019 Elsevier Patient Education  2020 Elsevier Inc.  

## 2020-09-18 ENCOUNTER — Other Ambulatory Visit: Payer: Self-pay

## 2020-09-18 MED ORDER — PANTOPRAZOLE SODIUM 40 MG PO TBEC
40.0000 mg | DELAYED_RELEASE_TABLET | Freq: Two times a day (BID) | ORAL | 6 refills | Status: DC
Start: 2020-09-18 — End: 2020-11-14

## 2020-10-02 ENCOUNTER — Other Ambulatory Visit: Payer: Self-pay | Admitting: Physician Assistant

## 2020-10-02 DIAGNOSIS — E782 Mixed hyperlipidemia: Secondary | ICD-10-CM

## 2020-10-02 NOTE — Telephone Encounter (Signed)
Requested Prescriptions  Pending Prescriptions Disp Refills   atorvastatin (LIPITOR) 10 MG tablet [Pharmacy Med Name: ATORVASTATIN CALCIUM 10 MG TAB] 90 tablet 0    Sig: TAKE 1 TABLET BY MOUTH ONCE A DAY     Cardiovascular:  Antilipid - Statins Failed - 10/02/2020 12:21 PM      Failed - LDL in normal range and within 360 days    LDL Chol Calc (NIH)  Date Value Ref Range Status  06/15/2020 107 (H) 0 - 99 mg/dL Final         Passed - Total Cholesterol in normal range and within 360 days    Cholesterol, Total  Date Value Ref Range Status  06/15/2020 196 100 - 199 mg/dL Final         Passed - HDL in normal range and within 360 days    HDL  Date Value Ref Range Status  06/15/2020 64 >39 mg/dL Final         Passed - Triglycerides in normal range and within 360 days    Triglycerides  Date Value Ref Range Status  06/15/2020 144 0 - 149 mg/dL Final         Passed - Patient is not pregnant      Passed - Valid encounter within last 12 months    Recent Outpatient Visits          2 weeks ago Need for influenza vaccination   [place], [place]   3 months ago Sore throat   [place], Bentley E, [place]   8 months ago Strain of lumbar paraspinal muscle, initial encounter   [facility], Kelby Aline, FNP   9 months ago [place], [place]   11 months ago Acute otalgia, right   [facility], [place], [place]

## 2020-11-14 ENCOUNTER — Other Ambulatory Visit: Payer: Self-pay | Admitting: Gastroenterology

## 2020-11-16 ENCOUNTER — Ambulatory Visit: Payer: Managed Care, Other (non HMO) | Attending: Internal Medicine

## 2020-11-16 ENCOUNTER — Other Ambulatory Visit: Payer: Self-pay | Admitting: Internal Medicine

## 2020-11-16 DIAGNOSIS — Z23 Encounter for immunization: Secondary | ICD-10-CM

## 2020-11-16 NOTE — Progress Notes (Signed)
   Covid-19 Vaccination Clinic  Name:  Teresa Torres    MRN: [id number] DOB: 1951/10/05  11/16/2020  [name] was observed post Covid-19 immunization for 30 minutes based on pre-vaccination screening without incident. She was provided with Vaccine Information Sheet and instruction to access the V-Safe system.   [name] was instructed to call 911 with any severe reactions post vaccine: Marland Kitchen Difficulty breathing  . Swelling of face and throat  . A fast heartbeat  . A bad rash all over body  . Dizziness and weakness   Immunizations Administered    Name Date Dose VIS Date Route   Pfizer COVID-19 Vaccine 11/16/2020 11:17 AM 0.3 mL 10/17/2020 Intramuscular   Manufacturer: [place]   Lot: [id number]   [place]: [id number]

## 2021-01-21 ENCOUNTER — Other Ambulatory Visit: Payer: Managed Care, Other (non HMO)

## 2021-01-21 DIAGNOSIS — Z20822 Contact with and (suspected) exposure to covid-19: Secondary | ICD-10-CM

## 2021-01-22 LAB — SARS-COV-2, NAA 2 DAY TAT

## 2021-01-22 LAB — NOVEL CORONAVIRUS, NAA: SARS-CoV-2, NAA: DETECTED — AB

## 2021-03-18 ENCOUNTER — Other Ambulatory Visit: Payer: Self-pay | Admitting: Gastroenterology

## 2021-04-17 ENCOUNTER — Other Ambulatory Visit: Payer: Self-pay | Admitting: Physician Assistant

## 2021-04-17 DIAGNOSIS — E782 Mixed hyperlipidemia: Secondary | ICD-10-CM

## 2021-07-13 ENCOUNTER — Other Ambulatory Visit: Payer: Self-pay | Admitting: Family Medicine

## 2021-07-13 DIAGNOSIS — E782 Mixed hyperlipidemia: Secondary | ICD-10-CM

## 2021-07-13 NOTE — Telephone Encounter (Signed)
Requested medication (s) are due for refill today: yes  Requested medication (s) are on the active medication list: yes  Last refill:  04/17/21  Future visit scheduled: no  Notes to clinic:  overdue lab work   Requested Prescriptions  Pending Prescriptions Disp Refills   atorvastatin (LIPITOR) 10 MG tablet [Pharmacy Med Name: ATORVASTATIN CALCIUM 10 MG TAB] 90 tablet 0    Sig: TAKE 1 TABLET BY MOUTH ONCE A DAY      Cardiovascular:  Antilipid - Statins Failed - 07/13/2021  9:28 AM      Failed - Total Cholesterol in normal range and within 360 days    Cholesterol, Total  Date Value Ref Range Status  06/15/2020 196 100 - 199 mg/dL Final          Failed - LDL in normal range and within 360 days    LDL Chol Calc (NIH)  Date Value Ref Range Status  06/15/2020 107 (H) 0 - 99 mg/dL Final          Failed - HDL in normal range and within 360 days    HDL  Date Value Ref Range Status  06/15/2020 64 >39 mg/dL Final          Failed - Triglycerides in normal range and within 360 days    Triglycerides  Date Value Ref Range Status  06/15/2020 144 0 - 149 mg/dL Final          Passed - Patient is not pregnant      Passed - Valid encounter within last 12 months    Recent Outpatient Visits           10 months ago Need for influenza vaccination   [place], Clearnce Sorrel, [place]   1 year ago Sore throat   [place], Vickki Muff, PA-C   1 year ago Strain of lumbar paraspinal muscle, initial encounter   [facility] [place], Kelby Aline, FNP   1 year ago [place], Clearnce Sorrel, [place]   1 year ago Acute otalgia, right   [place], Vickki Muff, [place]

## 2021-10-11 ENCOUNTER — Other Ambulatory Visit: Payer: Self-pay

## 2021-10-11 DIAGNOSIS — E782 Mixed hyperlipidemia: Secondary | ICD-10-CM

## 2021-10-11 MED ORDER — ATORVASTATIN CALCIUM 10 MG PO TABS
10.0000 mg | ORAL_TABLET | Freq: Every day | ORAL | 0 refills | Status: DC
Start: 2021-10-11 — End: 2021-11-16

## 2021-10-11 NOTE — Telephone Encounter (Signed)
Fax transmission was received by [facility] for Atorvastatin 10mg , patient has not been seen in office since 09/14/20, contacted patient and had her schedule follow up visit. Patient has a scheduled appointment 10/18/21 with [name]. KW

## 2021-10-17 ENCOUNTER — Other Ambulatory Visit: Payer: Self-pay | Admitting: Gastroenterology

## 2021-10-18 ENCOUNTER — Other Ambulatory Visit: Payer: Self-pay

## 2021-10-18 ENCOUNTER — Ambulatory Visit: Payer: Managed Care, Other (non HMO) | Admitting: Physician Assistant

## 2021-10-18 ENCOUNTER — Encounter: Payer: Self-pay | Admitting: Physician Assistant

## 2021-10-18 VITALS — BP 140/77 | HR 69 | Temp 98.7°F | Ht 62.0 in | Wt 165.3 lb

## 2021-10-18 DIAGNOSIS — D229 Melanocytic nevi, unspecified: Secondary | ICD-10-CM

## 2021-10-18 DIAGNOSIS — L299 Pruritus, unspecified: Secondary | ICD-10-CM

## 2021-10-18 DIAGNOSIS — I10 Essential (primary) hypertension: Secondary | ICD-10-CM | POA: Diagnosis not present

## 2021-10-18 DIAGNOSIS — Z1231 Encounter for screening mammogram for malignant neoplasm of breast: Secondary | ICD-10-CM

## 2021-10-18 DIAGNOSIS — E782 Mixed hyperlipidemia: Secondary | ICD-10-CM | POA: Diagnosis not present

## 2021-10-18 DIAGNOSIS — Z23 Encounter for immunization: Secondary | ICD-10-CM

## 2021-10-18 DIAGNOSIS — Z1159 Encounter for screening for other viral diseases: Secondary | ICD-10-CM | POA: Diagnosis not present

## 2021-10-18 MED ORDER — LISINOPRIL 5 MG PO TABS: 5.0000 mg | ORAL_TABLET | Freq: Every day | ORAL | 1 refills | Status: DC

## 2021-10-18 NOTE — Progress Notes (Signed)
Established patient visit   Patient: Teresa Torres   DOB: 21-Jun-1951   70 y.o. Female  MRN: [id number] Visit Date: 10/18/2021  Today's healthcare provider: [name], PA-C   Chief Complaint  Patient presents with   Hypertension   Subjective    HPI   Lipid/Cholesterol, Follow-up  Last lipid panel Other pertinent labs  Lab Results  Component Value Date   CHOL 196 06/15/2020   HDL 64 06/15/2020   LDLCALC 107 (H) 06/15/2020   TRIG 144 06/15/2020   CHOLHDL 3.1 06/15/2020   Lab Results  Component Value Date   ALT 20 06/15/2020   AST 17 06/15/2020   PLT 347 06/15/2020   TSH 2.670 06/15/2020     She was last seen for this 16 months ago. (06/15/20) Management since that visit includes continue lipitor 10 mg qd.  She reports excellent compliance with treatment. She is not having side effects.   Symptoms: No chest pain No chest pressure/discomfort  No dyspnea No lower extremity edema  No numbness or tingling of extremity No orthopnea  No palpitations No paroxysmal nocturnal dyspnea  No speech difficulty No syncope   Current diet:  tries to do a low fat diet Current exercise: walking and yard work  The 10-year ASCVD risk score (Arnett DK, et al., 2019) is: 11.5%   Values used to calculate the score:     Age: 47 years     Sex: Female     Is Non-Hispanic African American: No     Diabetic: No     Tobacco smoker: No     Systolic Blood Pressure: 127 mmHg     Is BP treated: Yes     HDL Cholesterol: 64 mg/dL     Total Cholesterol: 196 mg/dL   ---------------------------------------------------------------------------------------------------  Hypertension, follow-up  BP Readings from Last 3 Encounters:  10/18/21 140/77  09/14/20 (!) 148/76  08/21/20 (!) 154/78   Wt Readings from Last 3 Encounters:  10/18/21 165 lb 4.8 oz (75 kg)  09/14/20 173 lb 3.2 oz (78.6 kg)  08/21/20 174 lb 12.8 oz (79.3 kg)     She was last seen for hypertension 13 months ago.   BP at that visit was 148/76. Management since that visit includes no current medications believed it was high due to discomfort.  She is following a Low fat diet. She is exercising. She does not smoke.  Use of agents associated with hypertension: none.   Outside blood pressures are none. Symptoms: No chest pain No chest pressure  No palpitations No syncope  No dyspnea No orthopnea  No paroxysmal nocturnal dyspnea No lower extremity edema   Pertinent labs: Lab Results  Component Value Date   CHOL 196 06/15/2020   HDL 64 06/15/2020   LDLCALC 107 (H) 06/15/2020   TRIG 144 06/15/2020   CHOLHDL 3.1 06/15/2020   Lab Results  Component Value Date   NA 142 06/15/2020   K 4.4 06/15/2020   CREATININE 0.92 06/15/2020   GFRNONAA 65 06/15/2020   GLUCOSE 94 06/15/2020     The 10-year ASCVD risk score (Arnett DK, et al., 2019) is: 11.5%   Values used to calculate the score:     Age: 73 years     Sex: Female     Is Non-Hispanic African American: No     Diabetic: No     Tobacco smoker: No     Systolic Blood Pressure: 517 mmHg     Is BP treated: Yes  HDL Cholesterol: 64 mg/dL     Total Cholesterol: 196 mg/dL   She also reports today an itchy left ear--this occurs occasionally, but is very bothersome when it does. She has tried not wearing earrings but isnt sure if this helps. Denies bleeding, rash, hearing changes, discharge.  She would also like a spot on her back evaluated, she reports a small raised bump on her back that occasionally bleeds. Denies injury, pruritus, pain. Unable to see herself.  ---------------------------------------------------------------------------------------------------     Medications: Outpatient Medications Prior to Visit  Medication Sig   atorvastatin (LIPITOR) 10 MG tablet Take 1 tablet (10 mg total) by mouth daily.   COVID-19 mRNA vaccine, Pfizer, 30 MCG/0.3ML injection USE AS DIRECTED   cyclobenzaprine (FLEXERIL) 10 MG tablet Take 1 tablet  (10 mg total) by mouth 3 (three) times daily as needed for muscle spasms (causes  drowsiness.).   pantoprazole (PROTONIX) 40 MG tablet TAKE 1 TABLET BY MOUTH TWICE A DAY   triamcinolone cream (KENALOG) 0.1 % Apply 1 application topically 2 (two) times daily.   [DISCONTINUED] naproxen (EC NAPROSYN) 500 MG EC tablet Take 1 tablet (500 mg total) by mouth 2 (two) times daily with a meal. (Patient not taking: Reported on 10/18/2021)   [DISCONTINUED] predniSONE (STERAPRED UNI-PAK 21 TAB) 10 MG (21) TBPK tablet 6 day taper; take as directed on package instructions (Patient not taking: Reported on 10/18/2021)   No facility-administered medications prior to visit.    Review of Systems  Constitutional:  Negative for fatigue and fever.  Cardiovascular:  Negative for chest pain and leg swelling.  Skin:  Positive for color change.  Neurological:  Negative for dizziness, syncope, light-headedness and headaches.      Objective    Blood pressure 140/77, pulse 69, temperature 98.7 F (37.1 C), temperature source Oral, height 5\' 2"  (1.575 m), weight 165 lb 4.8 oz (75 kg), SpO2 98 %.   Physical Exam Constitutional:      General: She is awake.     Appearance: She is well-developed.  HENT:     Head: Normocephalic.  Eyes:     Conjunctiva/sclera: Conjunctivae normal.  Cardiovascular:     Rate and Rhythm: Normal rate and regular rhythm.     Heart sounds: Normal heart sounds.  Pulmonary:     Effort: Pulmonary effort is normal.     Breath sounds: Normal breath sounds.  Skin:    General: Skin is warm.     Comments: Lower right back she has a 1-2 cm raised erythematous crusted papule.   Left ear with dry skin.  Neurological:     Mental Status: She is alert and oriented to person, place, and time.  Psychiatric:        Attention and Perception: Attention normal.        Mood and Affect: Mood normal.        Speech: Speech normal.        Behavior: Behavior is cooperative.      No results found for  any visits on 10/18/21.  Assessment & Plan     . Problem List Items Addressed This Visit       Cardiovascular and Mediastinum   Primary hypertension    Consistently elevated over past few visits, previously chalked up to pain , but no pain today. Will start a low dose of Lisinopril today. Discussed taking BP at home, she will call office if reading < 100/60 or if feeling unwell. Will f/u 1 mo.  Relevant Medications   lisinopril (ZESTRIL) 5 MG tablet     Musculoskeletal and Integument   Pruritus    Of left ear, advised topical OTC hydrocortisone.         Other   HYPERLIPIDEMIA, MIXED - Primary    Will recheck fasting to assess stability on statin.  No complaints on medication.       Relevant Medications   lisinopril (ZESTRIL) 5 MG tablet   Other Relevant Orders   Lipid panel   Comprehensive metabolic panel   Atypical mole    Refer to derm to r/o basal cell ca on right lower back.       Relevant Orders   Ambulatory referral to Dermatology   Other Visit Diagnoses     Encounter for screening mammogram for breast cancer       Relevant Orders   MM 3D SCREEN BREAST BILATERAL   Encounter for hepatitis C screening test for low risk patient       Relevant Orders   Hepatitis C antibody        Return in about 1 month (around 11/18/2021) for hypertension.      {I, [name], PA-C have reviewed all documentation for this visit. The documentation on  10/18/2021 for the exam, diagnosis, procedures, and orders are all accurate and complete.    [name], PA-C  [facility] [phone] (phone) [phone] (fax)  [place]

## 2021-10-18 NOTE — Assessment & Plan Note (Addendum)
Refer to derm to r/o basal cell ca on right lower back.

## 2021-10-18 NOTE — Assessment & Plan Note (Addendum)
Consistently elevated over past few visits, previously chalked up to pain , but no pain today. Will start a low dose of Lisinopril today. Discussed taking BP at home, she will call office if reading < 100/60 or if feeling unwell. Will f/u 1 mo.

## 2021-10-18 NOTE — Assessment & Plan Note (Addendum)
Will recheck fasting to assess stability on statin.  No complaints on medication.

## 2021-10-18 NOTE — Assessment & Plan Note (Signed)
Of left ear, advised topical OTC hydrocortisone.

## 2021-10-23 ENCOUNTER — Other Ambulatory Visit: Payer: Self-pay

## 2021-10-23 LAB — COMPREHENSIVE METABOLIC PANEL
ALT: 16 IU/L (ref 0–32)
AST: 15 IU/L (ref 0–40)
Albumin/Globulin Ratio: 2.1 (ref 1.2–2.2)
Albumin: 4.1 g/dL (ref 3.8–4.8)
Alkaline Phosphatase: 65 IU/L (ref 44–121)
BUN/Creatinine Ratio: 13 (ref 12–28)
BUN: 13 mg/dL (ref 8–27)
Bilirubin Total: 0.5 mg/dL (ref 0.0–1.2)
CO2: 26 mmol/L (ref 20–29)
Calcium: 9.6 mg/dL (ref 8.7–10.3)
Chloride: 106 mmol/L (ref 96–106)
Creatinine, Ser: 0.98 mg/dL (ref 0.57–1.00)
Globulin, Total: 2 g/dL (ref 1.5–4.5)
Glucose: 91 mg/dL (ref 70–99)
Potassium: 4.2 mmol/L (ref 3.5–5.2)
Sodium: 144 mmol/L (ref 134–144)
Total Protein: 6.1 g/dL (ref 6.0–8.5)
eGFR: 63 mL/min/{1.73_m2} (ref >59)

## 2021-10-23 LAB — LIPID PANEL
Chol/HDL Ratio: 3.2 ratio (ref 0.0–4.4)
Cholesterol, Total: 178 mg/dL (ref 100–199)
HDL: 55 mg/dL (ref >39)
LDL Chol Calc (NIH): 92 mg/dL (ref 0–99)
Triglycerides: 179 mg/dL — ABNORMAL HIGH (ref 0–149)
VLDL Cholesterol Cal: 31 mg/dL (ref 5–40)

## 2021-10-23 LAB — HEPATITIS C ANTIBODY: Hep C Virus Ab: <0.1 s/co ratio (ref 0.0–0.9)

## 2021-10-23 MED ORDER — PANTOPRAZOLE SODIUM 40 MG PO TBEC: 40.0000 mg | DELAYED_RELEASE_TABLET | Freq: Two times a day (BID) | ORAL | 6 refills | Status: DC

## 2021-10-24 ENCOUNTER — Ambulatory Visit: Payer: Managed Care, Other (non HMO)

## 2021-10-24 ENCOUNTER — Ambulatory Visit: Payer: Managed Care, Other (non HMO) | Attending: Internal Medicine

## 2021-10-24 ENCOUNTER — Other Ambulatory Visit: Payer: Self-pay

## 2021-10-24 DIAGNOSIS — Z23 Encounter for immunization: Secondary | ICD-10-CM

## 2021-10-24 MED ORDER — PFIZER COVID-19 VAC BIVALENT 30 MCG/0.3ML IM SUSP
INTRAMUSCULAR | 0 refills | Status: DC
  Filled 2021-10-24: qty 0.3, 1d supply, fill #0

## 2021-10-24 NOTE — Progress Notes (Signed)
   Covid-19 Vaccination Clinic  Name:  Teresa Torres    MRN: [id number] DOB: 09/10/1951  10/24/2021  [name] was observed post Covid-19 immunization for 15 minutes without incident. She was provided with Vaccine Information Sheet and instruction to access the V-Safe system.   [name] was instructed to call 911 with any severe reactions post vaccine: Difficulty breathing  Swelling of face and throat  A fast heartbeat  A bad rash all over body  Dizziness and weakness   Immunizations Administered     Name Date Dose VIS Date Route   Pfizer Covid-19 Vaccine Bivalent Booster 10/24/2021  1:03 PM 0.3 mL 08/28/2021 Intramuscular   Manufacturer: [place]   Lot: [id number]   [place]: [phone]

## 2021-11-11 ENCOUNTER — Encounter: Payer: Self-pay | Admitting: Emergency Medicine

## 2021-11-11 ENCOUNTER — Other Ambulatory Visit: Payer: Self-pay

## 2021-11-11 ENCOUNTER — Ambulatory Visit
Admission: EM | Admit: 2021-11-11 | Discharge: 2021-11-11 | Disposition: A | Discharge status: Home / Self Care | Payer: Managed Care, Other (non HMO) | Attending: Emergency Medicine | Admitting: Emergency Medicine

## 2021-11-11 DIAGNOSIS — J0100 Acute maxillary sinusitis, unspecified: Secondary | ICD-10-CM

## 2021-11-11 DIAGNOSIS — H109 Unspecified conjunctivitis: Secondary | ICD-10-CM

## 2021-11-11 DIAGNOSIS — I10 Essential (primary) hypertension: Secondary | ICD-10-CM

## 2021-11-11 MED ORDER — POLYMYXIN B-TRIMETHOPRIM 10000-0.1 UNIT/ML-% OP SOLN: 1.0000 [drp] | Freq: Four times a day (QID) | OPHTHALMIC | 0 refills | Status: AC

## 2021-11-11 MED ORDER — AMOXICILLIN 875 MG PO TABS: 875.0000 mg | ORAL_TABLET | Freq: Two times a day (BID) | ORAL | 0 refills | Status: AC

## 2021-11-11 MED ORDER — BENZONATATE 100 MG PO CAPS: 100.0000 mg | ORAL_CAPSULE | Freq: Three times a day (TID) | ORAL | 0 refills | Status: DC | PRN

## 2021-11-11 NOTE — ED Triage Notes (Signed)
Pt presents with cough x 8 days and  right eye drainage that started yesterday.

## 2021-11-11 NOTE — Discharge Instructions (Addendum)
Take the amoxicillin and Tessalon Perles as directed.   Use the antibiotic eyedrops as prescribed.  Follow-up with your eye doctor for a recheck in 1 to 2 days if your symptoms are not improving.    Your blood pressure is elevated today at 184/84; repeat 137/87.  Please have this rechecked by your primary care provider in 1-2 weeks.

## 2021-11-11 NOTE — ED Provider Notes (Signed)
Teresa Torres    CSN: [id number] Arrival date & time: 11/11/21  1041      History   Chief Complaint Chief Complaint  Patient presents with   Cough   Eye Drainage    HPI Teresa Torres is a 70 y.o. female.  Patient presents with 8-day history of postnasal drip, congestion, cough productive of yellow phlegm.  She also reports redness, itching, drainage from right eye x1 day.  No eye trauma.  No acute eye pain or changes in vision.  She denies fever, chills, shortness of breath, vomiting, diarrhea, or other symptoms.  Treatment attempted at home with multiple OTC cough medications.  Her medical history includes hypertension.  The history is provided by the patient and medical records.   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   TONSILLECTOMY  1961    OB History   No obstetric history on file.      Home Medications    Prior to Admission medications   Medication Sig Start Date End Date Taking? Authorizing Provider  amoxicillin (AMOXIL) 875 MG tablet Take 1 tablet (875 mg total) by mouth 2 (two) times daily for 10 days. 11/11/21 11/21/21 Yes Sharion Balloon, NP  atorvastatin (LIPITOR) 10 MG tablet Take 1 tablet (10 mg total) by mouth daily. 10/11/21  Yes Gwyneth Sprout, FNP  benzonatate (TESSALON) 100 MG capsule Take 1 capsule (100 mg total) by mouth 3 (three) times daily as needed for cough. 11/11/21  Yes Sharion Balloon, NP  lisinopril  (ZESTRIL) 5 MG tablet Take 1 tablet (5 mg total) by mouth daily. 10/18/21 11/17/21 Yes Drubel, Ria Comment, PA-C  pantoprazole (PROTONIX) 40 MG tablet Take 1 tablet (40 mg total) by mouth 2 (two) times daily. 10/23/21  Yes Lucilla Lame, MD  trimethoprim-polymyxin b (POLYTRIM) ophthalmic solution Place 1 drop into both eyes 4 (four) times daily for 7 days. 11/11/21 11/18/21 Yes Sharion Balloon, NP  COVID-19 mRNA bivalent vaccine, Pfizer, (PFIZER COVID-19 VAC BIVALENT) injection Inject into the muscle. 10/24/21   Carlyle Basques, MD  COVID-19 mRNA vaccine, Pfizer, 30 MCG/0.3ML injection USE AS DIRECTED 11/16/20 11/16/21  Carlyle Basques, MD  cyclobenzaprine (FLEXERIL) 10 MG tablet Take 1 tablet (10 mg total) by mouth 3 (three) times daily as needed for muscle spasms (causes  drowsiness.). 01/12/20   Flinchum, Kelby Aline, FNP  triamcinolone cream (KENALOG) 0.1 % Apply 1 application topically 2 (two) times daily. 09/14/20   Mar Daring, PA-C    Family History Family History  Problem Relation Age of Onset   Breast cancer Paternal Grandmother     Social History Social History   Tobacco Use   Smoking status: Former   Smokeless tobacco: Never   Tobacco comments:    smoked cigarettes about 1 pack per day; quit in 2007, smoked fro 20 years  Vaping Use   Vaping Use: Never  used  Substance Use Topics   Alcohol use: Yes    Comment: Drinks wine; Occasional alcohol use   Drug use: No     Allergies   Compazine [prochlorperazine edisylate], Aspirin, and Nitrofurantoin   Review of Systems Review of Systems  Constitutional:  Negative for chills and fever.  HENT:  Positive for congestion and postnasal drip. Negative for ear pain and sore throat.   Eyes:  Positive for discharge, redness and itching. Negative for pain and visual disturbance.  Respiratory:  Positive for cough. Negative for shortness of breath.   Cardiovascular:  Negative for chest pain and palpitations.  Gastrointestinal:   Negative for diarrhea and vomiting.  Skin:  Negative for color change and rash.  All other systems reviewed and are negative.   Physical Exam Triage Vital Signs ED Triage Vitals  Enc Vitals Group     BP 11/11/21 1141 (!) 184/84     Pulse Rate 11/11/21 1141 77     Resp 11/11/21 1142 18     Temp 11/11/21 1141 99.2 F (37.3 C)     Temp Source 11/11/21 1141 Oral     SpO2 11/11/21 1141 96 %     Weight --      Height --      Head Circumference --      Peak Flow --      Pain Score --      Pain Loc --      Pain Edu? --      Excl. in [place]? --    No data found.  Updated Vital Signs BP 137/87 (BP Location: Left Arm)   Pulse 77   Temp 99.2 F (37.3 C) (Oral)   Resp 18   SpO2 96%   Visual Acuity Right Eye Distance:   Left Eye Distance:   Bilateral Distance:    Right Eye Near:   Left Eye Near:    Bilateral Near:     Physical Exam Vitals and nursing note reviewed.  Constitutional:      General: She is not in acute distress.    Appearance: She is well-developed.  HENT:     Head: Normocephalic and atraumatic.     Right Ear: Tympanic membrane normal.     Left Ear: Tympanic membrane normal.     Nose: Congestion present.     Mouth/Throat:     Mouth: Mucous membranes are moist.     Pharynx: Oropharynx is clear.  Eyes:     General: Lids are normal. Vision grossly intact.     Extraocular Movements: Extraocular movements intact.     Conjunctiva/sclera:     Left eye: Left conjunctiva is injected.     Pupils: Pupils are equal, round, and reactive to light.  Cardiovascular:     Rate and Rhythm: Normal rate and regular rhythm.     Heart sounds: Normal heart sounds.  Pulmonary:     Effort: Pulmonary effort is normal. No respiratory distress.     Breath sounds: Normal breath sounds.  Abdominal:     Palpations: Abdomen is soft.     Tenderness: There is no abdominal tenderness.  Musculoskeletal:     Cervical back: Neck supple.  Skin:    General: Skin is warm and dry.   Neurological:     Mental Status: She is alert.  Psychiatric:        Mood and Affect: Mood normal.        Behavior: Behavior normal.     UC Treatments /  Results  Labs (all labs ordered are listed, but only abnormal results are displayed) Labs Reviewed - No data to display  EKG   Radiology No results found.  Procedures Procedures (including critical care time)  Medications Ordered in UC Medications - No data to display  Initial Impression / Assessment and Plan / UC Course  I have reviewed the triage vital signs and the nursing notes.  Pertinent labs & imaging results that were available during my care of the patient were reviewed by me and considered in my medical decision making (see chart for details).  Acute sinusitis, conjunctivitis.  Elevated blood pressure reading with hypertension.  Patient declines COVID test.  Treating with amoxicillin and Tessalon Perles.  Instructed patient to follow-up with her PCP if her symptoms are not improving.  Treating conjunctivitis with Polytrim eyedrops.  Instructed her to follow-up with her eye care provider if her symptoms are not improving.  Discussed that her blood pressure is elevated today and needs to be rechecked by her PCP in 1 to 2 weeks.  Education provided on managing hypertension.  Patient agrees plan of care.   Final Clinical Impressions(s) / UC Diagnoses   Final diagnoses:  Acute non-recurrent maxillary sinusitis  Elevated blood pressure reading in office with diagnosis of hypertension  Conjunctivitis of both eyes, unspecified conjunctivitis type     Discharge Instructions      Take the amoxicillin and Tessalon Perles as directed.   Use the antibiotic eyedrops as prescribed.  Follow-up with your eye doctor for a recheck in 1 to 2 days if your symptoms are not improving.    Your blood pressure is elevated today at 184/84; repeat 137/87.  Please have this rechecked by your primary care provider in 1-2 weeks.           ED Prescriptions     Medication Sig Dispense Auth. Provider   benzonatate (TESSALON) 100 MG capsule Take 1 capsule (100 mg total) by mouth 3 (three) times daily as needed for cough. 21 capsule Sharion Balloon, NP   amoxicillin (AMOXIL) 875 MG tablet Take 1 tablet (875 mg total) by mouth 2 (two) times daily for 10 days. 20 tablet Sharion Balloon, NP   trimethoprim-polymyxin b (POLYTRIM) ophthalmic solution Place 1 drop into both eyes 4 (four) times daily for 7 days. 10 mL Sharion Balloon, NP      PDMP not reviewed this encounter.   Sharion Balloon, NP 11/11/21 [phone]

## 2021-11-16 ENCOUNTER — Other Ambulatory Visit: Payer: Self-pay | Admitting: Family Medicine

## 2021-11-16 DIAGNOSIS — E782 Mixed hyperlipidemia: Secondary | ICD-10-CM

## 2021-11-16 NOTE — Telephone Encounter (Signed)
Requested Prescriptions  Pending Prescriptions Disp Refills  . atorvastatin (LIPITOR) 10 MG tablet [Pharmacy Med Name: ATORVASTATIN CALCIUM 10 MG TAB] 90 tablet 2    Sig: TAKE 1 TABLET BY MOUTH ONCE DAILY     Cardiovascular:  Antilipid - Statins Failed - 11/16/2021 10:44 AM      Failed - Triglycerides in normal range and within 360 days    Triglycerides  Date Value Ref Range Status  10/22/2021 179 (H) 0 - 149 mg/dL Final         Passed - Total Cholesterol in normal range and within 360 days    Cholesterol, Total  Date Value Ref Range Status  10/22/2021 178 100 - 199 mg/dL Final         Passed - LDL in normal range and within 360 days    LDL Chol Calc (NIH)  Date Value Ref Range Status  10/22/2021 92 0 - 99 mg/dL Final         Passed - HDL in normal range and within 360 days    HDL  Date Value Ref Range Status  10/22/2021 55 >39 mg/dL Final         Passed - Patient is not pregnant      Passed - Valid encounter within last 12 months    Recent Outpatient Visits          4 weeks ago [place], [place] [name], PA-C   1 year ago Need for influenza vaccination   [place], Clearnce Sorrel, [place]   1 year ago Sore throat   [place], PA-C   1 year ago Strain of lumbar paraspinal muscle, initial encounter   [facility], Kelby Aline, FNP   1 year ago [place] [facility], Clearnce Sorrel, [place]      Future Appointments            In 1 week Thedore Mins, Ria Comment, PA-C [employer], [place]   In 5 months Ralene Bathe, MD [place]

## 2021-11-29 ENCOUNTER — Encounter: Payer: Self-pay | Admitting: Physician Assistant

## 2021-11-29 ENCOUNTER — Ambulatory Visit: Payer: Managed Care, Other (non HMO) | Admitting: Physician Assistant

## 2021-11-29 ENCOUNTER — Other Ambulatory Visit: Payer: Self-pay

## 2021-11-29 VITALS — BP 157/79 | HR 70 | Ht 62.0 in | Wt 162.0 lb

## 2021-11-29 DIAGNOSIS — R0981 Nasal congestion: Secondary | ICD-10-CM

## 2021-11-29 DIAGNOSIS — I10 Essential (primary) hypertension: Secondary | ICD-10-CM

## 2021-11-29 MED ORDER — AZELASTINE-FLUTICASONE 137-50 MCG/ACT NA SUSP
1.0000 | Freq: Two times a day (BID) | NASAL | 1 refills | Status: DC
Start: 2021-11-29 — End: 2023-10-02

## 2021-11-29 NOTE — Assessment & Plan Note (Signed)
Afebrile, do not believe this is recurrent sinusitis but residual postnasal drip and sinus congestion.  Discussed in detail nasal irrigation/nasal sprays.  Rx azelastine/Flonase nasal spray.  She can DC the home Flonase.  Gave option for additional nasal spray or replacing both of them with 1.

## 2021-11-29 NOTE — Assessment & Plan Note (Signed)
Patient reports home blood pressures before she started taking sinus medications were 130s/unknown.  Confident lisinopril is working to lower her blood pressure.  We will follow-up 3 months.

## 2021-11-29 NOTE — Progress Notes (Signed)
Established Patient Office Visit  Subjective:  Patient ID: Teresa Torres, female    DOB: Mar 02, 1951  Age: 70 y.o. MRN: [id number]  CC:  Chief Complaint  Patient presents with   Hyperlipidemia   Hypertension    HPI Teresa Torres is a 70 y/o female who presents today for hypertension follow-up and follow-up of recurrent sinusitis.  She states she had a "traditional" sinus infection 10/21, treated with Augmentin and Tessalon Perles.  She states that she feels better than she did end of October but still suffers from postnasal drip, sinus congestion which can make her cough.  Unable to lie flat at night as this causes her postnasal drip to worsen.  She has a humidifier at home.  She does have Flonase at home but has not been using consistently as she did not think that it would work with so much nasal congestion.  Denies fever/chills, shortness of breath, wheezing.  In terms of her blood pressure follow-up, no side effects patient reports on lisinopril, not dizzy or lightheaded.  Reports home blood pressure readings before she started feeling sick were around 130s as the top number.  Currently she has tried almost every over-the-counter cough medication/decongestant which she believes is raising her blood pressure.  Past Medical History:  Diagnosis Date   Arthritis    of spine   Hiatal hernia    Hyperlipidemia     Past Surgical History:  Procedure Laterality Date   ABDOMINAL HYSTERECTOMY  1984   Partial   BREAST BIOPSY Right 1972   EXCISIONAL - NEG   CHOLECYSTECTOMY  2007-2008   KNEE SURGERY  1980   TONSILLECTOMY  1961    Family History  Problem Relation Age of Onset   Breast cancer Paternal Grandmother     Social History   Socioeconomic History   Marital status: Married    Spouse name: Judine Arciniega   Number of children: Not on file   Years of education: Not on file   Highest education level: Not on file  Occupational History   Not on file  Tobacco Use   Smoking status: Former    Smokeless tobacco: Never  Vaping Use   Vaping Use: Never used  Substance and Sexual Activity   Alcohol use: Yes   Drug use: Never   Sexual activity: Yes    Partners: Male  Other Topics Concern   Not on file  Social History Narrative   Not on file   Social Determinants of Health   Financial Resource Strain: Not on file  Food Insecurity: Not on file  Transportation Needs: Not on file  Physical Activity: Not on file  Stress: Not on file  Social Connections: Not on file  Intimate Partner Violence: Not on file    Outpatient Medications Prior to Visit  Medication Sig Dispense Refill   atorvastatin (LIPITOR) 10 MG tablet TAKE 1 TABLET BY MOUTH ONCE DAILY 90 tablet 2   benzonatate (TESSALON) 100 MG capsule Take 1 capsule (100 mg total) by mouth 3 (three) times daily as needed for cough. 21 capsule 0   cyclobenzaprine (FLEXERIL) 10 MG tablet Take 1 tablet (10 mg total) by mouth 3 (three) times daily as needed for muscle spasms (causes  drowsiness.). 30 tablet 0   lisinopril (ZESTRIL) 5 MG tablet Take 1 tablet (5 mg total) by mouth daily. 30 tablet 1   niacin 500 MG tablet Take 500 mg by mouth at bedtime.     pantoprazole (PROTONIX) 40 MG tablet Take 1  tablet (40 mg total) by mouth 2 (two) times daily. 60 tablet 6   triamcinolone cream (KENALOG) 0.1 % Apply 1 application topically 2 (two) times daily. 30 g 0   COVID-19 mRNA bivalent vaccine, Pfizer, (PFIZER COVID-19 VAC BIVALENT) injection Inject into the muscle. 0.3 mL 0   No facility-administered medications prior to visit.    Allergies  Allergen Reactions   Compazine [Prochlorperazine Edisylate] Anaphylaxis   Aspirin Nausea And Vomiting   Nitrofurantoin Nausea And Vomiting    ROS Review of Systems  Constitutional:  Negative for fever.  HENT:  Positive for congestion, postnasal drip, rhinorrhea and sinus pain.   Respiratory:  Positive for cough. Negative for shortness of breath and wheezing.   Cardiovascular:  Negative for  chest pain.     Objective:    Physical Exam Constitutional:      Appearance: Normal appearance. She is not ill-appearing.  HENT:     Head: Normocephalic.     Right Ear: Tympanic membrane normal.     Left Ear: Tympanic membrane normal.     Nose: Congestion present.     Comments: Erythema and dry naris    Mouth/Throat:     Pharynx: Posterior oropharyngeal erythema present. No oropharyngeal exudate.  Cardiovascular:     Rate and Rhythm: Normal rate and regular rhythm.     Pulses: Normal pulses.     Heart sounds: Normal heart sounds. No murmur heard. Pulmonary:     Effort: Pulmonary effort is normal.     Breath sounds: Normal breath sounds. No stridor. No wheezing, rhonchi or rales.  Neurological:     Mental Status: She is alert and oriented to person, place, and time.  Psychiatric:        Mood and Affect: Mood normal.        Behavior: Behavior normal.    Blood pressure (!) 157/79, pulse 70, height 5' 2"  (1.575 m), weight 162 lb (73.5 kg).  Wt Readings from Last 3 Encounters:  11/29/21 162 lb (73.5 kg)  10/18/21 165 lb 4.8 oz (75 kg)  09/14/20 173 lb 3.2 oz (78.6 kg)     Health Maintenance Due  Topic Date Due   Pneumonia Vaccine 1+ Years old (1 - PCV) Never done   Zoster Vaccines- Shingrix (1 of 2) Never done   MAMMOGRAM  07/11/2019    There are no preventive care reminders to display for this patient.  Lab Results  Component Value Date   TSH 2.670 06/15/2020   Lab Results  Component Value Date   WBC 7.6 06/15/2020   HGB 15.3 06/15/2020   HCT 47.1 (H) 06/15/2020   MCV 90 06/15/2020   PLT 347 06/15/2020   Lab Results  Component Value Date   NA 144 10/22/2021   K 4.2 10/22/2021   CO2 26 10/22/2021   GLUCOSE 91 10/22/2021   BUN 13 10/22/2021   CREATININE 0.98 10/22/2021   BILITOT 0.5 10/22/2021   ALKPHOS 65 10/22/2021   AST 15 10/22/2021   ALT 16 10/22/2021   PROT 6.1 10/22/2021   ALBUMIN 4.1 10/22/2021   CALCIUM 9.6 10/22/2021   ANIONGAP 7  09/02/2013   EGFR 63 10/22/2021   Lab Results  Component Value Date   CHOL 178 10/22/2021   Lab Results  Component Value Date   HDL 55 10/22/2021   Lab Results  Component Value Date   LDLCALC 92 10/22/2021   Lab Results  Component Value Date   TRIG 179 (H) 10/22/2021   Lab Results  Component Value Date   CHOLHDL 3.2 10/22/2021   No results found for: HGBA1C    Assessment & Plan:   Problem List Items Addressed This Visit       Cardiovascular and Mediastinum   Primary hypertension - Primary    Patient reports home blood pressures before she started taking sinus medications were 130s/unknown.  Confident lisinopril is working to lower her blood pressure.  We will follow-up 3 months.        Other   Nasal congestion    Afebrile, do not believe this is recurrent sinusitis but residual postnasal drip and sinus congestion.  Discussed in detail nasal irrigation/nasal sprays.  Rx azelastine/Flonase nasal spray.  She can DC the home Flonase.  Gave option for additional nasal spray or replacing both of them with 1.      Relevant Medications   Azelastine-Fluticasone 137-50 MCG/ACT SUSP   Discussed need for pneumonia vaccination.  As she is currently ill today declines but will discuss again in 3 months. Follow-up: Return in about 3 months (around 02/27/2022) for hypertension.   I, [name], PA-C have reviewed all documentation for this visit. The documentation on  11/29/2021 for the exam, diagnosis, procedures, and orders are all accurate and complete.  [name], PA-C [facility]

## 2021-11-30 ENCOUNTER — Other Ambulatory Visit: Payer: Self-pay | Admitting: Physician Assistant

## 2021-11-30 DIAGNOSIS — I10 Essential (primary) hypertension: Secondary | ICD-10-CM

## 2022-02-27 NOTE — Progress Notes (Signed)
? ?I,Sha'taria Tyson,acting as a Education administrator for [employer], PA-C.,have documented all relevant documentation on the behalf of [name], PA-C,as directed by  [name], PA-C while in the presence of [name], PA-C. ? ?Established Patient Office Visit ? ?Subjective:  ?Patient ID: Teresa Torres, female    DOB: [birth year]  Age: 71 y.o. MRN: [id number] ? ?CC: cc. Htn f/u ? ?HPI ?Teresa Torres presents for hypertensin follow-up. ? ?Hypertension, follow-up ? ?BP Readings from Last 3 Encounters:  ?02/28/22 130/68  ?11/29/21 (!) 157/79  ?11/11/21 137/87  ? Wt Readings from Last 3 Encounters:  ?02/28/22 158 lb (71.7 kg)  ?11/29/21 162 lb (73.5 kg)  ?10/18/21 165 lb 4.8 oz (75 kg)  ?  ? ?She was last seen for hypertension 3 months ago.  ?BP at that visit was 155/79. Management since that visit includes continue current treatment. Elevated BP was thought to be secondary to decongestant use.  ? ?She reports excellent compliance with treatment. ?She is not having side effects. ?She is following a Regular diet. ?She is exercising. ?She does not smoke. ? ?Use of agents associated with hypertension: none.  ? ?Outside blood pressures are 130/77 this morning about 7:45 am usually uses left arm. ?Symptoms: ?No chest pain No chest pressure  ?No palpitations No syncope  ?No dyspnea No orthopnea  ?No paroxysmal nocturnal dyspnea No lower extremity edema  ? ?Pertinent labs: ?Lab Results  ?Component Value Date  ? CHOL 178 10/22/2021  ? HDL 55 10/22/2021  ? [place] 92 10/22/2021  ? TRIG 179 (H) 10/22/2021  ? CHOLHDL 3.2 10/22/2021  ? Lab Results  ?Component Value Date  ? NA 144 10/22/2021  ? K 4.2 10/22/2021  ? CREATININE 0.98 10/22/2021  ? EGFR 63 10/22/2021  ? GLUCOSE 91 10/22/2021  ? TSH 2.670 06/15/2020  ?  ? ?The 10-year ASCVD risk score (Arnett DK, et al., 2019) is: 10.1%  ? ?---------------------------------------------------------------------------------------------------  ?Antihistamines too dry  ?Past Medical History:   ?Diagnosis Date  ? 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 Hiatal hernia   ? Hyperlipidemia   ? ? ?Past Surgical History:  ?Procedure Laterality Date  ? ABDOMINAL HYSTERECTOMY  1984  ? Partial  ? BREAST BIOPSY Right 1972  ? EXCISIONAL - NEG  ? CHOLECYSTECTOMY  2007-2008  ? KNEE SURGERY  1980  ? TONSILLECTOMY  1961  ? ? ?Family History  ?Problem Relation Age of Onset  ? Breast cancer Paternal Grandmother   ? ? ?Social History  ? ?Socioeconomic History  ? Marital status: Married  ?  Spouse name: Lorelle Macaluso  ? Number of children: Not on file  ? Years of education: Not on file  ? Highest education level: Not on file  ?Occupational History  ? Not on file  ?Tobacco Use  ? Smoking status: Former  ? Smokeless tobacco: Never  ?Vaping Use  ? Vaping Use: Never used  ?Substance and Sexual Activity  ? Alcohol use: Yes  ? Drug use: Never  ? Sexual activity: Yes  ?  Partners: Male  ?Other Topics Concern  ? Not on file  ?Social History Narrative  ? Not on file  ? ?Social Determinants of Health  ? ?Financial Resource Strain: Not on file  ?Food Insecurity: Not on file  ?Transportation Needs: Not on file  ?Physical Activity: Not on file  ?Stress: Not on file  ?Social Connections: Not on file  ?Intimate Partner Violence: Not on file  ? ? ?Outpatient Medications Prior to Visit  ?  Medication Sig Dispense Refill  ? atorvastatin (LIPITOR) 10 MG tablet TAKE 1 TABLET BY MOUTH ONCE DAILY 90 tablet 2  ? Azelastine-Fluticasone 137-50 MCG/ACT SUSP Place 1 spray into the nose every 12 (twelve) hours. 23 g 1  ? cyclobenzaprine (FLEXERIL) 10 MG tablet Take 1 tablet (10 mg total) by mouth 3 (three) times daily as needed for muscle spasms (causes  drowsiness.). 30 tablet 0  ? lisinopril (ZESTRIL) 5 MG tablet TAKE 1 TABLET BY MOUTH ONCE A DAY 90 tablet 1  ? niacin 500 MG tablet Take 500 mg by mouth at bedtime.    ? pantoprazole (PROTONIX) 40 MG tablet Take 1 tablet (40 mg total) by mouth 2 (two) times daily. 60 tablet 6  ? triamcinolone cream (KENALOG) 0.1 % Apply  1 application topically 2 (two) times daily. 30 g 0  ? benzonatate (TESSALON) 100 MG capsule Take 1 capsule (100 mg total) by mouth 3 (three) times daily as needed for cough. (Patient not taking: Reported on 02/28/2022) 21 capsule 0  ? ?No facility-administered medications prior to visit.  ? ? ?Allergies  ?Allergen Reactions  ? Compazine [Prochlorperazine Edisylate] Anaphylaxis  ? Aspirin Nausea And Vomiting  ? Nitrofurantoin Nausea And Vomiting  ? ? ?ROS ?Review of Systems  ?Constitutional:  Negative for fatigue and fever.  ?Respiratory:  Negative for cough and shortness of breath.   ?Cardiovascular:  Negative for chest pain and leg swelling.  ?Gastrointestinal:  Negative for abdominal pain.  ?Neurological:  Negative for dizziness and headaches.  ? ?  ?Objective:  ?  ?Physical Exam ?Constitutional:   ?   Appearance: Normal appearance. She is not ill-appearing.  ?HENT:  ?   Head: Normocephalic.  ?Eyes:  ?   Conjunctiva/sclera: Conjunctivae normal.  ?Cardiovascular:  ?   Rate and Rhythm: Normal rate and regular rhythm.  ?   Pulses: Normal pulses.  ?   Heart sounds: Normal heart sounds.  ?Pulmonary:  ?   Effort: Pulmonary effort is normal.  ?   Breath sounds: Normal breath sounds.  ?Musculoskeletal:  ?   Right lower leg: No edema.  ?   Left lower leg: No edema.  ?Neurological:  ?   Mental Status: She is oriented to person, place, and time.  ?Psychiatric:     ?   Mood and Affect: Mood normal.     ?   Behavior: Behavior normal.  ? ? ?BP 130/68 (BP Location: Right Arm, Patient Position: Sitting, Cuff Size: Normal)   Pulse 70   Temp 97.9 ?F (36.6 ?C) (Oral)   Ht 5' 2"  (1.575 m)   Wt 158 lb (71.7 kg)   SpO2 99%   BMI 28.90 kg/m?  ?Wt Readings from Last 3 Encounters:  ?02/28/22 158 lb (71.7 kg)  ?11/29/21 162 lb (73.5 kg)  ?10/18/21 165 lb 4.8 oz (75 kg)  ? ? ? ?Health Maintenance Due  ?Topic Date Due  ? Zoster Vaccines- Shingrix (1 of 2) Never done  ? MAMMOGRAM  07/11/2019  ? ? ?There are no preventive care reminders  to display for this patient. ? ?Lab Results  ?Component Value Date  ? TSH 2.670 06/15/2020  ? ?Lab Results  ?Component Value Date  ? WBC 7.6 06/15/2020  ? HGB 15.3 06/15/2020  ? HCT 47.1 (H) 06/15/2020  ? MCV 90 06/15/2020  ? PLT 347 06/15/2020  ? ?Lab Results  ?Component Value Date  ? NA 144 10/22/2021  ? K 4.2 10/22/2021  ? CO2 26 10/22/2021  ? GLUCOSE  91 10/22/2021  ? BUN 13 10/22/2021  ? CREATININE 0.98 10/22/2021  ? BILITOT 0.5 10/22/2021  ? ALKPHOS 65 10/22/2021  ? AST 15 10/22/2021  ? ALT 16 10/22/2021  ? PROT 6.1 10/22/2021  ? ALBUMIN 4.1 10/22/2021  ? CALCIUM 9.6 10/22/2021  ? ANIONGAP 7 09/02/2013  ? EGFR 63 10/22/2021  ? ?Lab Results  ?Component Value Date  ? CHOL 178 10/22/2021  ? ?Lab Results  ?Component Value Date  ? HDL 55 10/22/2021  ? ?Lab Results  ?Component Value Date  ? [place] 92 10/22/2021  ? ?Lab Results  ?Component Value Date  ? TRIG 179 (H) 10/22/2021  ? ?Lab Results  ?Component Value Date  ? CHOLHDL 3.2 10/22/2021  ? ?No results found for: HGBA1C ? ?  ?Assessment & Plan:  ? ?Problem List Items Addressed This Visit   ? ?  ? Cardiovascular and Mediastinum  ? Primary hypertension - Primary  ?  Stable well controlled  ?Continue lisinopril 5 mg ?F/u 40mo ?  ?  ?  ?Postponed shingles and pneumonia as she has a busy weekend ?Plans to call and schedule nurse visits for these vaccines ? ?Follow-up: Return in about 6 months (around 08/31/2022) for CPE.  ? ? ?I, LMikey Kirschner PA-C have reviewed all documentation for this visit. The documentation on  02/28/2022 for the exam, diagnosis, procedures, and orders are all accurate and complete. ? ?LMikey Kirschner PA-C ?[place]?[id number] ?[place] [address]?Office: [phone]?Fax: [phone] ?

## 2022-02-28 ENCOUNTER — Ambulatory Visit: Payer: No Typology Code available for payment source | Admitting: Physician Assistant

## 2022-02-28 ENCOUNTER — Encounter: Payer: Self-pay | Admitting: Physician Assistant

## 2022-02-28 ENCOUNTER — Other Ambulatory Visit: Payer: Self-pay

## 2022-02-28 VITALS — BP 130/68 | HR 70 | Temp 97.9°F | Ht 62.0 in | Wt 158.0 lb

## 2022-02-28 DIAGNOSIS — I10 Essential (primary) hypertension: Secondary | ICD-10-CM

## 2022-02-28 NOTE — Assessment & Plan Note (Signed)
Stable well controlled  ?Continue lisinopril 5 mg ?F/u 25mo  ?

## 2022-02-28 NOTE — Patient Instructions (Addendum)
Please contact [phone] to schedule your mammogram. They will ask which location you prefer to be seen in.  ?Please feel free to contact us if you have any further questions or concerns  ?

## 2022-04-04 ENCOUNTER — Ambulatory Visit: Payer: No Typology Code available for payment source | Admitting: Physician Assistant

## 2022-04-11 ENCOUNTER — Ambulatory Visit
Admission: RE | Admit: 2022-04-11 | Discharge: 2022-04-11 | Disposition: A | Discharge status: Home / Self Care | Payer: No Typology Code available for payment source | Source: Ambulatory Visit | Attending: Physician Assistant | Admitting: Physician Assistant

## 2022-04-11 DIAGNOSIS — Z1231 Encounter for screening mammogram for malignant neoplasm of breast: Secondary | ICD-10-CM | POA: Insufficient documentation

## 2022-04-23 ENCOUNTER — Ambulatory Visit: Payer: No Typology Code available for payment source | Admitting: Dermatology

## 2022-04-23 DIAGNOSIS — D229 Melanocytic nevi, unspecified: Secondary | ICD-10-CM

## 2022-04-23 DIAGNOSIS — C44519 Basal cell carcinoma of skin of other part of trunk: Secondary | ICD-10-CM | POA: Diagnosis not present

## 2022-04-23 DIAGNOSIS — L578 Other skin changes due to chronic exposure to nonionizing radiation: Secondary | ICD-10-CM

## 2022-04-23 DIAGNOSIS — D485 Neoplasm of uncertain behavior of skin: Secondary | ICD-10-CM

## 2022-04-23 DIAGNOSIS — C4491 Basal cell carcinoma of skin, unspecified: Secondary | ICD-10-CM

## 2022-04-23 HISTORY — DX: Basal cell carcinoma of skin, unspecified: C44.91

## 2022-04-23 NOTE — Patient Instructions (Signed)

## 2022-04-23 NOTE — Progress Notes (Signed)
? ?New Patient Visit ? ?Subjective  ?Teresa Torres is a 71 y.o. female who presents for the following: Other (New patient - spot on back that needs to be checked). ?The patient has spots, moles and lesions to be evaluated, some may be new or changing and the patient has concerns that these could be cancer. ? ?The following portions of the chart were reviewed this encounter and updated as appropriate:  ? Tobacco  Allergies  Meds  Problems  Med Hx  Surg Hx  Fam Hx   ?  ?Review of Systems:  No other skin or systemic complaints except as noted in HPI or Assessment and Plan. ? ?Objective  ?Well appearing patient in no apparent distress; mood and affect are within normal limits. ? ?A focused examination was performed including back. Relevant physical exam findings are noted in the Assessment and Plan. ? ?Right low back above waistline ?1.5 cm pink patch ? ? ? ? ? ?Assessment & Plan  ?Neoplasm of uncertain behavior of skin ?Right low back above waistline ? ?Epidermal / dermal shaving ? ?Lesion diameter (cm):  1.5 ?Informed consent: discussed and consent obtained   ?Timeout: patient name, date of birth, surgical site, and procedure verified   ?Procedure prep:  Patient was prepped and draped in usual sterile fashion ?Prep type:  Isopropyl alcohol ?Anesthesia: the lesion was anesthetized in a standard fashion   ?Anesthetic:  1% lidocaine w/ epinephrine 1-100,000 buffered w/ 8.4% NaHCO3 ?Instrument used: flexible razor blade   ?Hemostasis achieved with: pressure, aluminum chloride and electrodesiccation   ?Outcome: patient tolerated procedure well   ?Post-procedure details: sterile dressing applied and wound care instructions given   ?Dressing type: bandage and petrolatum   ? ?Destruction of lesion ?Complexity: extensive   ?Destruction method: electrodesiccation and curettage   ?Informed consent: discussed and consent obtained   ?Timeout:  patient name, date of birth, surgical site, and procedure verified ?Procedure  prep:  Patient was prepped and draped in usual sterile fashion ?Prep type:  Isopropyl alcohol ?Anesthesia: the lesion was anesthetized in a standard fashion   ?Anesthetic:  1% lidocaine w/ epinephrine 1-100,000 buffered w/ 8.4% NaHCO3 ?Curettage performed in three different directions: Yes   ?Electrodesiccation performed over the curetted area: Yes   ?Lesion length (cm):  1.5 ?Lesion width (cm):  1.5 ?Final wound size (cm):  2.1 ?Hemostasis achieved with:  pressure and aluminum chloride ?Outcome: patient tolerated procedure well with no complications   ?Post-procedure details: sterile dressing applied and wound care instructions given   ?Dressing type: bandage and petrolatum   ? ?Specimen 1 - Surgical pathology ?Differential Diagnosis: BCC vs other  ?Check Margins: No ?EDC today ? ?Actinic Damage ?- chronic, secondary to cumulative UV radiation exposure/sun exposure over time ?- diffuse scaly erythematous macules with underlying dyspigmentation ?- Recommend daily broad spectrum sunscreen SPF 30+ to sun-exposed areas, reapply every 2 hours as needed.  ?- Recommend staying in the shade or wearing long sleeves, sun glasses (UVA+UVB protection) and wide brim hats (4-inch brim around the entire circumference of the hat). ?- Call for new or changing lesions. ? ?Melanocytic Nevi ?- Tan-brown and/or pink-flesh-colored symmetric macules and papules ?- Benign appearing on exam today ?- Observation ?- Call clinic for new or changing moles ?- Recommend daily use of broad spectrum spf 30+ sunscreen to sun-exposed areas.  ? ?Return in about 3 months (around 07/23/2022) for TBSE. ? ?I, Ashok Cordia, CMA, am acting as scribe for Sarina Ser, MD . ?Documentation: I have reviewed  the above documentation for accuracy and completeness, and I agree with the above. ? ?Sarina Ser, MD ? ? ?

## 2022-04-28 ENCOUNTER — Telehealth: Payer: Self-pay

## 2022-04-28 NOTE — Telephone Encounter (Signed)
Left pt msg to call for bx result/sh °

## 2022-04-28 NOTE — Telephone Encounter (Signed)
-----   Message from Ralene Bathe, MD sent at 04/26/2022 11:09 AM EDT ----- ?Diagnosis ?Skin , right lowe back above waistline ?BASAL CELL CARCINOMA, SUPERFICIAL AND NODULAR PATTERNS, CLOSE TO MARGIN ? ?Cancer - BCC ?Already treated ?Recheck next visit ?

## 2022-05-04 ENCOUNTER — Encounter: Payer: Self-pay | Admitting: Dermatology

## 2022-05-08 ENCOUNTER — Telehealth: Payer: Self-pay

## 2022-05-08 NOTE — Telephone Encounter (Signed)
Left message on voicemail to return my call.  

## 2022-05-08 NOTE — Telephone Encounter (Signed)
-----   Message from Ralene Bathe, MD sent at 04/26/2022 11:09 AM EDT ----- ?Diagnosis ?Skin , right lowe back above waistline ?BASAL CELL CARCINOMA, SUPERFICIAL AND NODULAR PATTERNS, CLOSE TO MARGIN ? ?Cancer - BCC ?Already treated ?Recheck next visit ?

## 2022-05-15 ENCOUNTER — Telehealth: Payer: Self-pay

## 2022-05-15 NOTE — Telephone Encounter (Signed)
Advised pt of bx result/sh ?

## 2022-05-15 NOTE — Telephone Encounter (Signed)
-----   Message from Ralene Bathe, MD sent at 04/26/2022 11:09 AM EDT ----- Diagnosis Skin , right lowe back above waistline BASAL CELL CARCINOMA, SUPERFICIAL AND NODULAR PATTERNS, CLOSE TO MARGIN  Cancer - BCC Already treated Recheck next visit

## 2022-05-27 ENCOUNTER — Other Ambulatory Visit: Payer: Self-pay | Admitting: Gastroenterology

## 2022-07-04 ENCOUNTER — Other Ambulatory Visit: Payer: Self-pay | Admitting: Physician Assistant

## 2022-07-04 DIAGNOSIS — I10 Essential (primary) hypertension: Secondary | ICD-10-CM

## 2022-07-23 ENCOUNTER — Ambulatory Visit: Payer: No Typology Code available for payment source | Admitting: Dermatology

## 2022-07-23 DIAGNOSIS — L720 Epidermal cyst: Secondary | ICD-10-CM

## 2022-07-23 DIAGNOSIS — Z85828 Personal history of other malignant neoplasm of skin: Secondary | ICD-10-CM | POA: Diagnosis not present

## 2022-07-23 DIAGNOSIS — L814 Other melanin hyperpigmentation: Secondary | ICD-10-CM

## 2022-07-23 DIAGNOSIS — Z1283 Encounter for screening for malignant neoplasm of skin: Secondary | ICD-10-CM | POA: Diagnosis not present

## 2022-07-23 DIAGNOSIS — L578 Other skin changes due to chronic exposure to nonionizing radiation: Secondary | ICD-10-CM | POA: Diagnosis not present

## 2022-07-23 DIAGNOSIS — D1800 Hemangioma unspecified site: Secondary | ICD-10-CM

## 2022-07-23 DIAGNOSIS — D229 Melanocytic nevi, unspecified: Secondary | ICD-10-CM

## 2022-07-23 DIAGNOSIS — L821 Other seborrheic keratosis: Secondary | ICD-10-CM

## 2022-07-23 NOTE — Patient Instructions (Addendum)
 Pre-Operative Instructions  You are scheduled for a surgical procedure at [facility]. We recommend you read the following instructions. If you have any questions or concerns, please call the office at [phone].  Shower and wash the entire body with soap and water the day of your surgery paying special attention to cleansing at and around the planned surgery site.  Avoid aspirin or aspirin containing products at least fourteen (14) days prior to your surgical procedure and for at least one week (7 Days) after your surgical procedure. If you take aspirin on a regular basis for heart disease or history of stroke or for any other reason, we may recommend you continue taking aspirin but please notify us if you take this on a regular basis. Aspirin can cause more bleeding to occur during surgery as well as prolonged bleeding and bruising after surgery.   Avoid other nonsteroidal pain medications at least one week prior to surgery and at least one week prior to your surgery. These include medications such as Ibuprofen (Motrin, Advil and Nuprin), Naprosyn, Voltaren, Relafen, etc. If medications are used for therapeutic reasons, please inform us as they can cause increased bleeding or prolonged bleeding during and bruising after surgical procedures.   Please advise us if you are taking any "blood thinner" medications such as Coumadin or Dipyridamole or Plavix or similar medications. These cause increased bleeding and prolonged bleeding during procedures and bruising after surgical procedures. We may have to consider discontinuing these medications briefly prior to and shortly after your surgery if safe to do so.   Please inform us of all medications you are currently taking. All medications that are taken regularly should be taken the day of surgery as you always do. Nevertheless, we need to be informed of what medications you are taking prior to surgery to know whether they will affect the  procedure or cause any complications.   Please inform us of any medication allergies. Also inform us of whether you have allergies to Latex or rubber products or whether you have had any adverse reaction to Lidocaine or Epinephrine.  Please inform us of any prosthetic or artificial body parts such as artificial heart valve, joint replacements, etc., or similar condition that might require preoperative antibiotics.   We recommend avoidance of alcohol at least two weeks prior to surgery and continued avoidance for at least two weeks after surgery.   We recommend discontinuation of tobacco smoking at least two weeks prior to surgery and continued abstinence for at least two weeks after surgery.  Do not plan strenuous exercise, strenuous work or strenuous lifting for approximately four weeks after your surgery.   We request if you are unable to make your scheduled surgical appointment, please call us at least a week in advance or as soon as you are aware of a problem so that we can cancel or reschedule the appointment.   You MAY TAKE TYLENOL (acetaminophen) for pain as it is not a blood thinner.   PLEASE PLAN TO BE IN TOWN FOR TWO WEEKS FOLLOWING SURGERY, THIS IS IMPORTANT SO YOU CAN BE CHECKED FOR DRESSING CHANGES, SUTURE REMOVAL AND TO MONITOR FOR POSSIBLE COMPLICATIONS.    Due to recent changes in healthcare laws, you may see results of your pathology and/or laboratory studies on MyChart before the doctors have had a chance to review them. We understand that in some cases there may be results that are confusing or concerning to you. Please understand that not all results are   received at the same time and often the doctors may need to interpret multiple results in order to provide you with the best plan of care or course of treatment. Therefore, we ask that you please give us 2 business days to thoroughly review all your results before contacting the office for clarification. Should we see a  critical lab result, you will be contacted sooner.   If You Need Anything After Your Visit  If you have any questions or concerns for your doctor, please call our main line at [phone] and press option 4 to reach your doctor's medical assistant. If no one answers, please leave a voicemail as directed and we will return your call as soon as possible. Messages left after 4 pm will be answered the following business day.   You may also send us a message via MyChart. We typically respond to MyChart messages within 1-2 business days.  For prescription refills, please ask your pharmacy to contact our office. Our fax number is [phone].  If you have an urgent issue when the clinic is closed that cannot wait until the next business day, you can page your doctor at the number below.    Please note that while we do our best to be available for urgent issues outside of office hours, we are not available 24/7.   If you have an urgent issue and are unable to reach us, you may choose to seek medical care at your doctor's office, retail clinic, urgent care center, or emergency room.  If you have a medical emergency, please immediately call 911 or go to the emergency department.  Pager Numbers  - Dr. Kowalski: [phone]  - Dr. Moye: [phone]  - Dr. Stewart: [phone]  In the event of inclement weather, please call our main line at [phone] for an update on the status of any delays or closures.  Dermatology Medication Tips: Please keep the boxes that topical medications come in in order to help keep track of the instructions about where and how to use these. Pharmacies typically print the medication instructions only on the boxes and not directly on the medication tubes.   If your medication is too expensive, please contact our office at [phone] option 4 or send us a message through MyChart.   We are unable to tell what your co-pay for medications will be in advance as  this is different depending on your insurance coverage. However, we may be able to find a substitute medication at lower cost or fill out paperwork to get insurance to cover a needed medication.   If a prior authorization is required to get your medication covered by your insurance company, please allow us 1-2 business days to complete this process.  Drug prices often vary depending on where the prescription is filled and some pharmacies may offer cheaper prices.  The website www.goodrx.com contains coupons for medications through different pharmacies. The prices here do not account for what the cost may be with help from insurance (it may be cheaper with your insurance), but the website can give you the price if you did not use any insurance.  - You can print the associated coupon and take it with your prescription to the pharmacy.  - You may also stop by our office during regular business hours and pick up a GoodRx coupon card.  - If you need your prescription sent electronically to a different pharmacy, notify our office through [facility] MyChart or by phone at [phone] option 4.       Si Usted Necesita Algo Despus de Su Visita  Tambin puede enviarnos un mensaje a travs de MyChart. Por lo general respondemos a los mensajes de MyChart en el transcurso de 1 a 2 das hbiles.  Para renovar recetas, por favor pida a su farmacia que se ponga en contacto con nuestra oficina. Nuestro nmero de fax es el [phone].  Si tiene un asunto urgente cuando la clnica est cerrada y que no puede esperar hasta el siguiente da hbil, puede llamar/localizar a su doctor(a) al nmero que aparece a continuacin.   Por favor, tenga en cuenta que aunque hacemos todo lo posible para estar disponibles para asuntos urgentes fuera del horario de oficina, no estamos disponibles las 24 horas del da, los 7 das de la semana.   Si tiene un problema urgente y no puede comunicarse con nosotros, puede optar por  buscar atencin mdica  en el consultorio de su doctor(a), en una clnica privada, en un centro de atencin urgente o en una sala de emergencias.  Si tiene una emergencia mdica, por favor llame inmediatamente al 911 o vaya a la sala de emergencias.  Nmeros de bper  - Dr. Kowalski: [phone]  - Dra. Moye: [phone]  - Dra. Stewart: [phone]  En caso de inclemencias del tiempo, por favor llame a nuestra lnea principal al [phone] para una actualizacin sobre el estado de cualquier retraso o cierre.  Consejos para la medicacin en dermatologa: Por favor, guarde las cajas en las que vienen los medicamentos de uso tpico para ayudarle a seguir las instrucciones sobre dnde y cmo usarlos. Las farmacias generalmente imprimen las instrucciones del medicamento slo en las cajas y no directamente en los tubos del medicamento.   Si su medicamento es muy caro, por favor, pngase en contacto con nuestra oficina llamando al [phone] y presione la opcin 4 o envenos un mensaje a travs de MyChart.   No podemos decirle cul ser su copago por los medicamentos por adelantado ya que esto es diferente dependiendo de la cobertura de su seguro. Sin embargo, es posible que podamos encontrar un medicamento sustituto a menor costo o llenar un formulario para que el seguro cubra el medicamento que se considera necesario.   Si se requiere una autorizacin previa para que su compaa de seguros cubra su medicamento, por favor permtanos de 1 a 2 das hbiles para completar este proceso.  Los precios de los medicamentos varan con frecuencia dependiendo del lugar de dnde se surte la receta y alguna farmacias pueden ofrecer precios ms baratos.  El sitio web www.goodrx.com tiene cupones para medicamentos de diferentes farmacias. Los precios aqu no tienen en cuenta lo que podra costar con la ayuda del seguro (puede ser ms barato con su seguro), pero el sitio web puede darle el precio si no  utiliz ningn seguro.  - Puede imprimir el cupn correspondiente y llevarlo con su receta a la farmacia.  - Tambin puede pasar por nuestra oficina durante el horario de atencin regular y recoger una tarjeta de cupones de GoodRx.  - Si necesita que su receta se enve electrnicamente a una farmacia diferente, informe a nuestra oficina a travs de MyChart de [place] o por telfono llamando al [phone] y presione la opcin 4.  

## 2022-07-23 NOTE — Progress Notes (Signed)
   Follow-Up Visit   Subjective  Teresa Torres is a 71 y.o. female who presents for the following: Annual Exam (Hx BCC). The patient presents for Total-Body Skin Exam (TBSE) for skin cancer screening and mole check.  The patient has spots, moles and lesions to be evaluated, some may be new or changing.  The following portions of the chart were reviewed this encounter and updated as appropriate:   Tobacco  Allergies  Meds  Problems  Med Hx  Surg Hx  Fam Hx     Review of Systems:  No other skin or systemic complaints except as noted in HPI or Assessment and Plan.  Objective  Well appearing patient in no apparent distress; mood and affect are within normal limits.  A full examination was performed including scalp, head, eyes, ears, nose, lips, neck, chest, axillae, abdomen, back, buttocks, bilateral upper extremities, bilateral lower extremities, hands, feet, fingers, toes, fingernails, and toenails. All findings within normal limits unless otherwise noted below.  R lower eyelid Subcutaneous nodule 0.6 cm.   Assessment & Plan  Epidermal inclusion cyst R lower eyelid  Symptomatic, benign-appearing. Exam most consistent with an epidermal inclusion cyst. Discussed that a cyst is a benign growth that can grow over time and sometimes get irritated or inflamed. Recommend observation if it is not bothersome. Discussed option of surgical excision to remove it if it is growing, symptomatic, or other changes noted. Please call for new or changing lesions so they can be evaluated.  Lentigines - Scattered tan macules - Due to sun exposure - Benign-appearing, observe - Recommend daily broad spectrum sunscreen SPF 30+ to sun-exposed areas, reapply every 2 hours as needed. - Call for any changes  Seborrheic Keratoses - Stuck-on, waxy, tan-brown papules and/or plaques  - Benign-appearing - Discussed benign etiology and prognosis. - Observe - Call for any changes  Melanocytic Nevi -  Tan-brown and/or pink-flesh-colored symmetric macules and papules - Benign appearing on exam today - Observation - Call clinic for new or changing moles - Recommend daily use of broad spectrum spf 30+ sunscreen to sun-exposed areas.   Hemangiomas - Red papules - Discussed benign nature - Observe - Call for any changes  Actinic Damage - Chronic condition, secondary to cumulative UV/sun exposure - diffuse scaly erythematous macules with underlying dyspigmentation - Recommend daily broad spectrum sunscreen SPF 30+ to sun-exposed areas, reapply every 2 hours as needed.  - Staying in the shade or wearing long sleeves, sun glasses (UVA+UVB protection) and wide brim hats (4-inch brim around the entire circumference of the hat) are also recommended for sun protection.  - Call for new or changing lesions.  History of Basal Cell Carcinoma of the Skin - No evidence of recurrence today - Recommend regular full body skin exams - Recommend daily broad spectrum sunscreen SPF 30+ to sun-exposed areas, reapply every 2 hours as needed.  - Call if any new or changing lesions are noted between office visits  Skin cancer screening performed today.  Return in about 1 year (around 07/24/2023) for TBSE; cyst excision.  Luther Redo, CMA, am acting as scribe for Sarina Ser, MD . Documentation: I have reviewed the above documentation for accuracy and completeness, and I agree with the above.  Sarina Ser, MD

## 2022-07-27 ENCOUNTER — Encounter: Payer: Self-pay | Admitting: Dermatology

## 2022-07-28 ENCOUNTER — Other Ambulatory Visit: Payer: Self-pay | Admitting: Gastroenterology

## 2022-08-04 ENCOUNTER — Other Ambulatory Visit: Payer: Self-pay | Admitting: Physician Assistant

## 2022-08-04 DIAGNOSIS — E782 Mixed hyperlipidemia: Secondary | ICD-10-CM

## 2022-08-05 ENCOUNTER — Encounter: Payer: Self-pay | Admitting: Gastroenterology

## 2022-08-05 ENCOUNTER — Ambulatory Visit: Payer: No Typology Code available for payment source | Admitting: Gastroenterology

## 2022-08-05 VITALS — BP 143/85 | Temp 98.4°F | Ht 62.0 in | Wt 153.0 lb

## 2022-08-05 DIAGNOSIS — K219 Gastro-esophageal reflux disease without esophagitis: Secondary | ICD-10-CM | POA: Diagnosis not present

## 2022-08-05 MED ORDER — DEXLANSOPRAZOLE 60 MG PO CPDR: 60.0000 mg | DELAYED_RELEASE_CAPSULE | Freq: Every day | ORAL | 5 refills | Status: DC

## 2022-08-05 NOTE — Progress Notes (Signed)
Primary Care Physician: [name], PA-C  Primary Gastroenterologist:  Dr. Lucilla Lame  Chief Complaint  Patient presents with   Gastroesophageal Reflux    Pt reports Pantoprazole is no longer providing good Sx control nd is experiencing regurgitation... Pt states she does take Prilosec OTC in addition to Tums if Sx are severe   Follow-up    HPI: Teresa Torres is a 71 y.o. female here with report of continued heartburn.  The patient reports that she had good symptom control on Dexilant but it was not covered by her insurance of time.  She has been taking pantoprazole and continues to have symptoms.  There is no report of any unexplained weight loss fevers chills nausea vomiting black stools or bloody stools.  The patient is up-to-date with her colonoscopies. The patient would like to be restarted on Dexilant.   Past Medical History:  Diagnosis Date   Arthritis    of spine   Basal cell carcinoma 04/23/2022   Right low back above waistline, EDC   Hiatal hernia    Hyperlipidemia     Current Outpatient Medications  Medication Sig Dispense Refill   atorvastatin (LIPITOR) 10 MG tablet TAKE 1 TABLET BY MOUTH ONCE DAILY 90 tablet 2   Azelastine-Fluticasone 137-50 MCG/ACT SUSP Place 1 spray into the nose every 12 (twelve) hours. 23 g 1   cyclobenzaprine (FLEXERIL) 10 MG tablet Take 1 tablet (10 mg total) by mouth 3 (three) times daily as needed for muscle spasms (causes  drowsiness.). 30 tablet 0   lisinopril (ZESTRIL) 5 MG tablet TAKE 1 TABLET BY MOUTH ONCE A DAY 90 tablet 1   niacin 500 MG tablet Take 500 mg by mouth at bedtime.     pantoprazole (PROTONIX) 40 MG tablet Take 1 tablet (40 mg total) by mouth 2 (two) times daily before a meal. 60 tablet 0   triamcinolone cream (KENALOG) 0.1 % Apply 1 application topically 2 (two) times daily. 30 g 0   No current facility-administered medications for this visit.    Allergies as of 08/05/2022 - Review Complete 08/05/2022  Allergen  Reaction Noted   Compazine [prochlorperazine edisylate] Anaphylaxis 08/13/2015   Aspirin Nausea And Vomiting 08/13/2015   Nitrofurantoin Nausea And Vomiting 12/15/2016    ROS:  General: Negative for anorexia, weight loss, fever, chills, fatigue, weakness. ENT: Negative for hoarseness, difficulty swallowing , nasal congestion. CV: Negative for chest pain, angina, palpitations, dyspnea on exertion, peripheral edema.  Respiratory: Negative for dyspnea at rest, dyspnea on exertion, cough, sputum, wheezing.  GI: See history of present illness. GU:  Negative for dysuria, hematuria, urinary incontinence, urinary frequency, nocturnal urination.  Endo: Negative for unusual weight change.    Physical Examination:   BP (!) 143/85   Temp 98.4 F (36.9 C) (Oral)   Ht '5\' 2"'$  (1.575 m)   Wt 153 lb (69.4 kg)   BMI 27.98 kg/m   General: Well-nourished, well-developed in no acute distress.  Eyes: No icterus. Conjunctivae pink. Neuro: Alert and oriented x 3.  Grossly intact. Skin: Warm and dry, no jaundice.   Psych: Alert and cooperative, normal mood and affect.  Labs:    Imaging Studies: No results found.  Assessment and Plan:   Teresa Torres is a 71 y.o. y/o female who comes in with a history of long-standing heartburn.  The patient has been having almost daily heartburn symptoms while on pantoprazole and is supplementing with Tums.  The patient is much better Dexilant.  The patient  will be prescribed Dexilant again.  The patient has been explained the plan and agrees with it.     Lucilla Lame, MD. Marval Regal    Note: This dictation was prepared with Dragon dictation along with smaller phrase technology. Any transcriptional errors that result from this process are unintentional.

## 2022-08-07 ENCOUNTER — Telehealth: Payer: Self-pay | Admitting: Gastroenterology

## 2022-08-07 MED ORDER — DEXILANT 60 MG PO CPDR: 60.0000 mg | DELAYED_RELEASE_CAPSULE | Freq: Every day | ORAL | 5 refills | Status: DC

## 2022-08-07 NOTE — Addendum Note (Signed)
Addended by: Lurlean Nanny on: 08/07/2022 06:23 PM   Modules accepted: Orders

## 2022-08-07 NOTE — Addendum Note (Signed)
Addended by: Lurlean Nanny on: 08/07/2022 06:20 PM   Modules accepted: Orders

## 2022-08-07 NOTE — Telephone Encounter (Signed)
Prescription authorization for dexlansoprazole. Patients insurance is not wanting to cover the medication because it wants her to use a different medication. Pharmacy has a number for the doctor to call [phone] so he can let them know it is medically necessary for her to get this specific prescription. Any questions call Caryl Pina from [employer].

## 2022-08-07 NOTE — Telephone Encounter (Signed)
When I went to start PA with [facility]... it mentioned that name brand is preferred... Rx for Brand name Dexilant e-scribed to [place].... I called pharmacy and they had me on hold for 17 minutes. I called pt to let her know what I have done and that she should check with the pharmacy tomorrow to verify coverage of new Rx... Pt expressed understanding

## 2022-08-10 ENCOUNTER — Encounter: Payer: Self-pay | Admitting: Gastroenterology

## 2022-08-11 ENCOUNTER — Telehealth: Payer: Self-pay

## 2022-08-11 NOTE — Telephone Encounter (Signed)
PA submitted via covermymeds, awaiting response

## 2022-08-12 NOTE — Telephone Encounter (Signed)
Thisrequestisapprovedfrom  08/14/2023to08/13/2024

## 2022-08-26 ENCOUNTER — Encounter: Payer: Self-pay | Admitting: Family Medicine

## 2022-08-26 ENCOUNTER — Ambulatory Visit: Payer: No Typology Code available for payment source | Admitting: Family Medicine

## 2022-08-26 VITALS — BP 163/71 | HR 67 | Temp 98.0°F | Resp 16 | Ht 62.0 in | Wt 154.7 lb

## 2022-08-26 DIAGNOSIS — R300 Dysuria: Secondary | ICD-10-CM

## 2022-08-26 LAB — POCT URINALYSIS DIPSTICK
Bilirubin, UA: NEGATIVE
Blood, UA: NEGATIVE
Glucose, UA: NEGATIVE
Ketones, UA: NEGATIVE
Leukocytes, UA: NEGATIVE
Nitrite, UA: NEGATIVE
Protein, UA: NEGATIVE
Spec Grav, UA: <=1.005 — AB (ref 1.010–1.025)
Urobilinogen, UA: 0.2 E.U./dL
pH, UA: 6.5 (ref 5.0–8.0)

## 2022-08-26 MED ORDER — CEPHALEXIN 500 MG PO CAPS: 500.0000 mg | ORAL_CAPSULE | Freq: Four times a day (QID) | ORAL | 0 refills | Status: AC

## 2022-08-26 NOTE — Progress Notes (Signed)
   SUBJECTIVE:   CHIEF COMPLAINT / HPI:   URINARY SYMPTOMS  Dysuria: burning Urinary frequency: yes Urgency: yes Urinary incontinence: no Back pain: yes Suprapubic pain/pressure: yes Fever:  no Vomiting: no Relief with pyridium: yes Status: better/worse/stable Previous urinary tract infection: yes Recurrent urinary tract infection: no Vaginal discharge: no Treatments attempted: increasing fluids, pyridium    OBJECTIVE:   BP (!) 163/71 (BP Location: Right Arm, Patient Position: Sitting, Cuff Size: Normal)   Pulse 67   Temp 98 F (36.7 C) (Oral)   Resp 16   Ht '5\' 2"'$  (1.575 m)   Wt 154 lb 11.2 oz (70.2 kg)   BMI 28.30 kg/m   Gen: well appearing, in NAD Card: Reg rate Lungs: Comfortable WOB on RA Ext: WWP, no edema   ASSESSMENT/PLAN:   Dysuria UA largely normal however with symptoms consistent with UTI, h/o prior culture positive UTIs with similar symptoms, will start abx and send for culture with low threshold to stop antiobiotics once culture results.  Avoid bladder irritants.    Myles Gip, DO

## 2022-08-26 NOTE — Addendum Note (Signed)
Addended by: Myles Gip on: 08/26/2022 04:07 PM   Modules accepted: Orders

## 2022-08-29 LAB — URINE CULTURE

## 2022-09-04 NOTE — Progress Notes (Signed)
I,Sha'taria Tyson,acting as a Education administrator for [employer], PA-C.,have documented all relevant documentation on the behalf of [name], PA-C,as directed by  [name], PA-C while in the presence of [name], PA-C.   Established patient visit   Patient: Teresa Torres   DOB: 1951/07/28   71 y.o. Female  MRN: [id number] Visit Date: 09/05/2022  Today's healthcare provider: [name], PA-C   Cc. Chronic care f/u  Subjective    HPI  Pt reports for the last few weeks, a few times a week she will wake up with her heart pounding. Lasts a few seconds and then calms down. Does happen occasionally throughout the day. She reports extra stress recently. Denies chest pain, SOB.   She had an episode last night where she was waiting in line outside in the heat-- felt faint and nauseous. EMTs were called, BP was low, gave fluids. One episode of vomiting. Pt reports her BP was 80/40 but came back up and was able to enjoy the concert. Denies any lingering symptoms aside from a slight headache. She did not take her BP meds last night d/t the episode.  Hypertension, follow-up  BP Readings from Last 3 Encounters:  09/05/22 (!) 151/68  08/26/22 (!) 163/71  08/05/22 (!) 143/85   Wt Readings from Last 3 Encounters:  09/05/22 156 lb 1.6 oz (70.8 kg)  08/26/22 154 lb 11.2 oz (70.2 kg)  08/05/22 153 lb (69.4 kg)     She was last seen for hypertension 6 months ago.  BP at that visit was 130/68. Management since that visit includes continue lisinopril 5 mg.  She reports excellent compliance with treatment. She is having side effects. Coughing-- She is following a Regular diet. She is exercising. She does not smoke.  Use of agents associated with hypertension: none.   Outside blood pressures are 130's/70's Symptoms: No chest pain No chest pressure  Yes palpitations No syncope  No dyspnea No orthopnea  No paroxysmal nocturnal dyspnea Yes lower extremity edema   Pertinent  labs Lab Results  Component Value Date   CHOL 178 10/22/2021   HDL 55 10/22/2021   LDLCALC 92 10/22/2021   TRIG 179 (H) 10/22/2021   CHOLHDL 3.2 10/22/2021   Lab Results  Component Value Date   NA 144 10/22/2021   K 4.2 10/22/2021   CREATININE 0.98 10/22/2021   EGFR 63 10/22/2021   GLUCOSE 91 10/22/2021   TSH 2.670 06/15/2020     The 10-year ASCVD risk score (Arnett DK, et al., 2019) is: 14.9%  ---------------------------------------------------------------------------------------------------  Lipid/Cholesterol, Follow-up  Last lipid panel Other pertinent labs  Lab Results  Component Value Date   CHOL 178 10/22/2021   HDL 55 10/22/2021   LDLCALC 92 10/22/2021   TRIG 179 (H) 10/22/2021   CHOLHDL 3.2 10/22/2021   Lab Results  Component Value Date   ALT 16 10/22/2021   AST 15 10/22/2021   PLT 347 06/15/2020   TSH 2.670 06/15/2020     She was last seen for this 11 months ago.  Management since that visit includes continue current treatment.  She reports excellent compliance with treatment. She is not having side effects. document side effects if present  Symptoms: No chest pain No chest pressure/discomfort  No dyspnea Yes lower extremity edema  No numbness or tingling of extremity No orthopnea  Yes palpitations No paroxysmal nocturnal dyspnea  No speech difficulty No syncope   Current diet: well balanced Current exercise: walking  The 10-year ASCVD  risk score (Arnett DK, et al., 2019) is: 14.9%  ---------------------------------------------------------------------------------------------------  Medications: Outpatient Medications Prior to Visit  Medication Sig   atorvastatin (LIPITOR) 10 MG tablet TAKE 1 TABLET BY MOUTH ONCE DAILY   Azelastine-Fluticasone 137-50 MCG/ACT SUSP Place 1 spray into the nose every 12 (twelve) hours.   cyclobenzaprine (FLEXERIL) 10 MG tablet Take 1 tablet (10 mg total) by mouth 3 (three) times daily as needed for muscle spasms  (causes  drowsiness.).   DEXILANT 60 MG capsule Take 1 capsule (60 mg total) by mouth daily.   niacin 500 MG tablet Take 500 mg by mouth at bedtime.   pantoprazole (PROTONIX) 40 MG tablet Take 1 tablet (40 mg total) by mouth 2 (two) times daily before a meal.   triamcinolone cream (KENALOG) 0.1 % Apply 1 application topically 2 (two) times daily.   [DISCONTINUED] lisinopril (ZESTRIL) 5 MG tablet TAKE 1 TABLET BY MOUTH ONCE A DAY   No facility-administered medications prior to visit.    Review of Systems  Constitutional:  Negative for fatigue and fever.  Respiratory:  Positive for cough. Negative for shortness of breath.   Cardiovascular:  Positive for palpitations. Negative for chest pain and leg swelling.  Gastrointestinal:  Negative for abdominal pain.  Neurological:  Negative for dizziness and headaches.      Objective    Blood pressure (!) 151/68, pulse 70, height 5' 2"  (1.575 m), weight 156 lb 1.6 oz (70.8 kg), SpO2 100 %.   Physical Exam Constitutional:      General: She is awake.     Appearance: She is well-developed.  HENT:     Head: Normocephalic.  Eyes:     Conjunctiva/sclera: Conjunctivae normal.  Cardiovascular:     Rate and Rhythm: Normal rate and regular rhythm.     Heart sounds: Normal heart sounds.  Pulmonary:     Effort: Pulmonary effort is normal.     Breath sounds: Normal breath sounds.  Skin:    General: Skin is warm.  Neurological:     Mental Status: She is alert and oriented to person, place, and time.  Psychiatric:        Attention and Perception: Attention normal.        Mood and Affect: Mood normal.        Speech: Speech normal.        Behavior: Behavior is cooperative.      No results found for any visits on 09/05/22.  Assessment & Plan     Problem List Items Addressed This Visit       Cardiovascular and Mediastinum   Primary hypertension - Primary    Managed with lisinopril 5 mg , pt experiencing cough switch to losartan 25 mg   Check cmp  F/u 4 weeks       Relevant Medications   losartan (COZAAR) 25 MG tablet   Other Relevant Orders   Comprehensive Metabolic Panel (CMET)     Other   HYPERLIPIDEMIA, MIXED    Will check fasting lipids, LDL goal <70 Managed with lipitor 10 mg  The 10-year ASCVD risk score (Arnett DK, et al., 2019) is: 14.9%       Relevant Medications   losartan (COZAAR) 25 MG tablet   Other Relevant Orders   Lipid Profile   Palpitations    Will check cbc, cmp, tsh May be stress related If all bw normal, advised monitoring and can do a holter monitor       Relevant Orders   TSH +  free T4   CBC w/Diff/Platelet   Vasovagal episode    Appears resolved today Advised increase fluids Can skip bp med tonight if she wants to be cautious      Other Visit Diagnoses     Need for influenza vaccination       Relevant Orders   Flu Vaccine QUAD High Dose(Fluad) (Completed)        Return in about 4 weeks (around 10/03/2022) for hypertension.      I, [name], PA-C have reviewed all documentation for this visit. The documentation on  09/8/223  for the exam, diagnosis, procedures, and orders are all accurate and complete.  [name], PA-C [facility] [address] [place], [place], [address] Office: [phone] Fax: [place]

## 2022-09-05 ENCOUNTER — Ambulatory Visit (INDEPENDENT_AMBULATORY_CARE_PROVIDER_SITE_OTHER): Payer: No Typology Code available for payment source | Admitting: Physician Assistant

## 2022-09-05 ENCOUNTER — Encounter: Payer: Self-pay | Admitting: Physician Assistant

## 2022-09-05 VITALS — BP 151/68 | HR 70 | Ht 62.0 in | Wt 156.1 lb

## 2022-09-05 DIAGNOSIS — Z23 Encounter for immunization: Secondary | ICD-10-CM | POA: Diagnosis not present

## 2022-09-05 DIAGNOSIS — I10 Essential (primary) hypertension: Secondary | ICD-10-CM

## 2022-09-05 DIAGNOSIS — E782 Mixed hyperlipidemia: Secondary | ICD-10-CM

## 2022-09-05 DIAGNOSIS — R002 Palpitations: Secondary | ICD-10-CM

## 2022-09-05 DIAGNOSIS — R55 Syncope and collapse: Secondary | ICD-10-CM | POA: Insufficient documentation

## 2022-09-05 MED ORDER — LOSARTAN POTASSIUM 25 MG PO TABS: 25.0000 mg | ORAL_TABLET | Freq: Every day | ORAL | 1 refills | Status: DC

## 2022-09-05 NOTE — Assessment & Plan Note (Addendum)
Will check fasting lipids, LDL goal <70 Managed with lipitor 10 mg  The 10-year ASCVD risk score (Arnett DK, et al., 2019) is: 14.9%

## 2022-09-05 NOTE — Assessment & Plan Note (Addendum)
Managed with lisinopril 5 mg , pt experiencing cough switch to losartan 25 mg  Check cmp  F/u 4 weeks

## 2022-09-05 NOTE — Assessment & Plan Note (Signed)
Appears resolved today Advised increase fluids Can skip bp med tonight if she wants to be cautious

## 2022-09-05 NOTE — Assessment & Plan Note (Signed)
Will check cbc, cmp, tsh May be stress related If all bw normal, advised monitoring and can do a holter monitor

## 2022-09-06 LAB — CBC WITH DIFFERENTIAL/PLATELET
Basophils Absolute: 0.1 10*3/uL (ref 0.0–0.2)
Basos: 1 %
EOS (ABSOLUTE): 0.2 10*3/uL (ref 0.0–0.4)
Eos: 2 %
Hematocrit: 44.2 % (ref 34.0–46.6)
Hemoglobin: 14.4 g/dL (ref 11.1–15.9)
Immature Grans (Abs): 0.1 10*3/uL (ref 0.0–0.1)
Immature Granulocytes: 1 %
Lymphocytes Absolute: 2.8 10*3/uL (ref 0.7–3.1)
Lymphs: 32 %
MCH: 29.1 pg (ref 26.6–33.0)
MCHC: 32.6 g/dL (ref 31.5–35.7)
MCV: 89 fL (ref 79–97)
Monocytes Absolute: 0.6 10*3/uL (ref 0.1–0.9)
Monocytes: 7 %
Neutrophils Absolute: 5 10*3/uL (ref 1.4–7.0)
Neutrophils: 57 %
Platelets: 377 10*3/uL (ref 150–450)
RBC: 4.95 x10E6/uL (ref 3.77–5.28)
RDW: 13 % (ref 11.7–15.4)
WBC: 8.8 10*3/uL (ref 3.4–10.8)

## 2022-09-06 LAB — COMPREHENSIVE METABOLIC PANEL
ALT: 26 IU/L (ref 0–32)
AST: 22 IU/L (ref 0–40)
Albumin/Globulin Ratio: 2.2 (ref 1.2–2.2)
Albumin: 4.7 g/dL (ref 3.9–4.9)
Alkaline Phosphatase: 60 IU/L (ref 44–121)
BUN/Creatinine Ratio: 14 (ref 12–28)
BUN: 13 mg/dL (ref 8–27)
Bilirubin Total: 0.8 mg/dL (ref 0.0–1.2)
CO2: 23 mmol/L (ref 20–29)
Calcium: 10 mg/dL (ref 8.7–10.3)
Chloride: 104 mmol/L (ref 96–106)
Creatinine, Ser: 0.96 mg/dL (ref 0.57–1.00)
Globulin, Total: 2.1 g/dL (ref 1.5–4.5)
Glucose: 95 mg/dL (ref 70–99)
Potassium: 4.2 mmol/L (ref 3.5–5.2)
Sodium: 142 mmol/L (ref 134–144)
Total Protein: 6.8 g/dL (ref 6.0–8.5)
eGFR: 64 mL/min/{1.73_m2} (ref >59)

## 2022-09-06 LAB — TSH+FREE T4
Free T4: 1.36 ng/dL (ref 0.82–1.77)
TSH: 2.14 u[IU]/mL (ref 0.450–4.500)

## 2022-09-06 LAB — LIPID PANEL
Chol/HDL Ratio: 3.1 ratio (ref 0.0–4.4)
Cholesterol, Total: 218 mg/dL — ABNORMAL HIGH (ref 100–199)
HDL: 71 mg/dL (ref >39)
LDL Chol Calc (NIH): 131 mg/dL — ABNORMAL HIGH (ref 0–99)
Triglycerides: 92 mg/dL (ref 0–149)
VLDL Cholesterol Cal: 16 mg/dL (ref 5–40)

## 2022-09-08 ENCOUNTER — Other Ambulatory Visit: Payer: Self-pay | Admitting: Physician Assistant

## 2022-09-08 DIAGNOSIS — E782 Mixed hyperlipidemia: Secondary | ICD-10-CM

## 2022-09-08 MED ORDER — ATORVASTATIN CALCIUM 20 MG PO TABS: 20.0000 mg | ORAL_TABLET | Freq: Every day | ORAL | 3 refills | Status: DC

## 2022-09-09 ENCOUNTER — Ambulatory Visit: Payer: No Typology Code available for payment source | Admitting: Dermatology

## 2022-09-09 DIAGNOSIS — D485 Neoplasm of uncertain behavior of skin: Secondary | ICD-10-CM

## 2022-09-09 MED ORDER — MUPIROCIN 2 % EX OINT: 1.0000 | TOPICAL_OINTMENT | Freq: Every day | CUTANEOUS | 0 refills | Status: DC

## 2022-09-09 NOTE — Patient Instructions (Signed)

## 2022-09-09 NOTE — Progress Notes (Signed)
   Follow-Up Visit   Subjective  Teresa Torres is a 71 y.o. female who presents for the following: Cyst (Vs other of right lower eyelid - excise today).  The following portions of the chart were reviewed this encounter and updated as appropriate:   Tobacco  Allergies  Meds  Problems  Med Hx  Surg Hx  Fam Hx     Review of Systems:  No other skin or systemic complaints except as noted in HPI or Assessment and Plan.  Objective  Well appearing patient in no apparent distress; mood and affect are within normal limits.  A focused examination was performed including face. Relevant physical exam findings are noted in the Assessment and Plan.  Right Lower Eyelid Cystic papule 0.7CM    Assessment & Plan  Neoplasm of uncertain behavior of skin -symptomatic growing cyst Right Lower Eyelid  Skin excision  Lesion length (cm):  0.7 Lesion width (cm):  0.7 Margin per side (cm):  0 Total excision diameter (cm):  0.7 Informed consent: discussed and consent obtained   Timeout: patient name, date of birth, surgical site, and procedure verified   Procedure prep:  Patient was prepped and draped in usual sterile fashion Prep type:  Isopropyl alcohol and povidone-iodine Anesthesia: the lesion was anesthetized in a standard fashion   Anesthetic:  1% lidocaine w/ epinephrine 1-100,000 buffered w/ 8.4% NaHCO3 Instrument used comment:  #15c blade Hemostasis achieved with: pressure   Hemostasis achieved with comment:  Electrocautery Outcome: patient tolerated procedure well with no complications   Post-procedure details: sterile dressing applied and wound care instructions given   Dressing type: bandage, pressure dressing and bacitracin (mupirocin)    Skin repair Complexity:  Simple Final length (cm):  1.2 Reason for type of repair: reduce tension to allow closure, reduce the risk of dehiscence, infection, and necrosis, reduce subcutaneous dead space and avoid a hematoma, allow closure of the  large defect, preserve normal anatomy, preserve normal anatomical and functional relationships and enhance both functionality and cosmetic results   Undermining: area extensively undermined   Undermining comment:  Undermining defect 0.7cm Subcutaneous layers (deep stitches):  Subcutaneous suture technique: inverted dermal. Fine/surface layer approximation (top stitches):  Suture size:  5-0 Suture type: nylon   Stitches: simple interrupted   Suture removal (days):  7 Hemostasis achieved with: suture and pressure Outcome: patient tolerated procedure well with no complications   Post-procedure details: sterile dressing applied and wound care instructions given   Dressing type: bandage, pressure dressing and bacitracin (mupirocin)    mupirocin ointment (BACTROBAN) 2 % Apply 1 Application topically daily. With dressing changes  Cyst, excised today ( not sent in for pathology) Start Mupirocin oint qd to excision site  Return in about 1 week (around 09/16/2022) for suture removal.  I, Ashok Cordia, CMA, am acting as scribe for Sarina Ser, MD . Documentation: I have reviewed the above documentation for accuracy and completeness, and I agree with the above.  Sarina Ser, MD

## 2022-09-10 ENCOUNTER — Telehealth: Payer: Self-pay

## 2022-09-10 NOTE — Telephone Encounter (Signed)
Pt doing fine after yesterdays surgery./sh 

## 2022-09-12 ENCOUNTER — Encounter: Payer: Self-pay | Admitting: Dermatology

## 2022-09-15 ENCOUNTER — Telehealth: Payer: Self-pay | Admitting: Gastroenterology

## 2022-09-15 NOTE — Telephone Encounter (Signed)
Patient called stating she is having trouble getting her prescription of dexilant. Requesting call back from Dr Dorothey Baseman nurse.

## 2022-09-16 ENCOUNTER — Ambulatory Visit (INDEPENDENT_AMBULATORY_CARE_PROVIDER_SITE_OTHER): Payer: No Typology Code available for payment source | Admitting: Dermatology

## 2022-09-16 ENCOUNTER — Encounter: Payer: Self-pay | Admitting: Dermatology

## 2022-09-16 DIAGNOSIS — L720 Epidermal cyst: Secondary | ICD-10-CM

## 2022-09-16 DIAGNOSIS — Z4802 Encounter for removal of sutures: Secondary | ICD-10-CM

## 2022-09-16 NOTE — Patient Instructions (Signed)
Due to recent changes in healthcare laws, you may see results of your pathology and/or laboratory studies on MyChart before the doctors have had a chance to review them. We understand that in some cases there may be results that are confusing or concerning to you. Please understand that not all results are received at the same time and often the doctors may need to interpret multiple results in order to provide you with the best plan of care or course of treatment. Therefore, we ask that you please give us 2 business days to thoroughly review all your results before contacting the office for clarification. Should we see a critical lab result, you will be contacted sooner.   If You Need Anything After Your Visit  If you have any questions or concerns for your doctor, please call our main line at [phone] and press option 4 to reach your doctor's medical assistant. If no one answers, please leave a voicemail as directed and we will return your call as soon as possible. Messages left after 4 pm will be answered the following business day.   You may also send us a message via MyChart. We typically respond to MyChart messages within 1-2 business days.  For prescription refills, please ask your pharmacy to contact our office. Our fax number is [phone].  If you have an urgent issue when the clinic is closed that cannot wait until the next business day, you can page your doctor at the number below.    Please note that while we do our best to be available for urgent issues outside of office hours, we are not available 24/7.   If you have an urgent issue and are unable to reach us, you may choose to seek medical care at your doctor's office, retail clinic, urgent care center, or emergency room.  If you have a medical emergency, please immediately call 911 or go to the emergency department.  Pager Numbers  - Dr. Kowalski: [phone]  - Dr. Moye: [phone]  - Dr. Stewart:  [phone]  In the event of inclement weather, please call our main line at [phone] for an update on the status of any delays or closures.  Dermatology Medication Tips: Please keep the boxes that topical medications come in in order to help keep track of the instructions about where and how to use these. Pharmacies typically print the medication instructions only on the boxes and not directly on the medication tubes.   If your medication is too expensive, please contact our office at [phone] option 4 or send us a message through MyChart.   We are unable to tell what your co-pay for medications will be in advance as this is different depending on your insurance coverage. However, we may be able to find a substitute medication at lower cost or fill out paperwork to get insurance to cover a needed medication.   If a prior authorization is required to get your medication covered by your insurance company, please allow us 1-2 business days to complete this process.  Drug prices often vary depending on where the prescription is filled and some pharmacies may offer cheaper prices.  The website www.goodrx.com contains coupons for medications through different pharmacies. The prices here do not account for what the cost may be with help from insurance (it may be cheaper with your insurance), but the website can give you the price if you did not use any insurance.  - You can print the associated coupon and take it with   your prescription to the pharmacy.  - You may also stop by our office during regular business hours and pick up a GoodRx coupon card.  - If you need your prescription sent electronically to a different pharmacy, notify our office through [facility] MyChart or by phone at [phone] option 4.     Si Usted Necesita Algo Despus de Su Visita  Tambin puede enviarnos un mensaje a travs de MyChart. Por lo general respondemos a los mensajes de MyChart en el transcurso de 1 a 2  das hbiles.  Para renovar recetas, por favor pida a su farmacia que se ponga en contacto con nuestra oficina. Nuestro nmero de fax es el [phone].  Si tiene un asunto urgente cuando la clnica est cerrada y que no puede esperar hasta el siguiente da hbil, puede llamar/localizar a su doctor(a) al nmero que aparece a continuacin.   Por favor, tenga en cuenta que aunque hacemos todo lo posible para estar disponibles para asuntos urgentes fuera del horario de oficina, no estamos disponibles las 24 horas del da, los 7 das de la semana.   Si tiene un problema urgente y no puede comunicarse con nosotros, puede optar por buscar atencin mdica  en el consultorio de su doctor(a), en una clnica privada, en un centro de atencin urgente o en una sala de emergencias.  Si tiene una emergencia mdica, por favor llame inmediatamente al 911 o vaya a la sala de emergencias.  Nmeros de bper  - Dr. Kowalski: [phone]  - Dra. Moye: [phone]  - Dra. Stewart: [phone]  En caso de inclemencias del tiempo, por favor llame a nuestra lnea principal al [phone] para una actualizacin sobre el estado de cualquier retraso o cierre.  Consejos para la medicacin en dermatologa: Por favor, guarde las cajas en las que vienen los medicamentos de uso tpico para ayudarle a seguir las instrucciones sobre dnde y cmo usarlos. Las farmacias generalmente imprimen las instrucciones del medicamento slo en las cajas y no directamente en los tubos del medicamento.   Si su medicamento es muy caro, por favor, pngase en contacto con nuestra oficina llamando al [phone] y presione la opcin 4 o envenos un mensaje a travs de MyChart.   No podemos decirle cul ser su copago por los medicamentos por adelantado ya que esto es diferente dependiendo de la cobertura de su seguro. Sin embargo, es posible que podamos encontrar un medicamento sustituto a menor costo o llenar un formulario para que el  seguro cubra el medicamento que se considera necesario.   Si se requiere una autorizacin previa para que su compaa de seguros cubra su medicamento, por favor permtanos de 1 a 2 das hbiles para completar este proceso.  Los precios de los medicamentos varan con frecuencia dependiendo del lugar de dnde se surte la receta y alguna farmacias pueden ofrecer precios ms baratos.  El sitio web www.goodrx.com tiene cupones para medicamentos de diferentes farmacias. Los precios aqu no tienen en cuenta lo que podra costar con la ayuda del seguro (puede ser ms barato con su seguro), pero el sitio web puede darle el precio si no utiliz ningn seguro.  - Puede imprimir el cupn correspondiente y llevarlo con su receta a la farmacia.  - Tambin puede pasar por nuestra oficina durante el horario de atencin regular y recoger una tarjeta de cupones de GoodRx.  - Si necesita que su receta se enve electrnicamente a una farmacia diferente, informe a nuestra oficina a travs de MyChart de [facility]   o por telfono llamando al [phone] y presione la opcin 4.  

## 2022-09-16 NOTE — Telephone Encounter (Signed)
I called the pharmacy and now it seems that insurance is requiring a PA for quantity limit... PA completed and was approved... However, the cost would be over $200 for brand... I spoke to pt and she state the pharmacy told her that the generic seems to process and the will see if the cost would be less.... Pt will let me know if anything further is needed

## 2022-09-16 NOTE — Progress Notes (Signed)
   Follow-Up Visit   Subjective  Teresa Torres is a 71 y.o. female who presents for the following: Suture removal (S/P excision, R lower eyelid, patient here today for suture removal).  The following portions of the chart were reviewed this encounter and updated as appropriate:   Tobacco  Allergies  Meds  Problems  Med Hx  Surg Hx  Fam Hx     Review of Systems:  No other skin or systemic complaints except as noted in HPI or Assessment and Plan.  Objective  Well appearing patient in no apparent distress; mood and affect are within normal limits.  A focused examination was performed including the face. Relevant physical exam findings are noted in the Assessment and Plan.  R lower eyelid Healing excision site.   Assessment & Plan  Epidermal inclusion cyst R lower eyelid  Encounter for Removal of Sutures - Incision site at the R lower eyelid is clean, dry and intact - Wound cleansed, sutures removed, wound cleansed and steri strips applied.  - Patient advised to keep steri-strips dry until they fall off. - Scars remodel for a full year. - Once steri-strips fall off, patient can apply over-the-counter silicone scar cream each night to help with scar remodeling if desired. - Patient advised to call with any concerns or if they notice any new or changing lesions.  Return for appointment as scheduled.  Luther Redo, CMA, am acting as scribe for Sarina Ser, MD . Documentation: I have reviewed the above documentation for accuracy and completeness, and I agree with the above.  Sarina Ser, MD

## 2022-09-16 NOTE — Addendum Note (Signed)
Addended by: Lurlean Nanny on: 09/16/2022 10:48 AM   Modules accepted: Orders

## 2022-09-17 ENCOUNTER — Telehealth: Payer: Self-pay

## 2022-09-17 MED ORDER — DEXLANSOPRAZOLE 60 MG PO CPDR: 60.0000 mg | DELAYED_RELEASE_CAPSULE | Freq: Every day | ORAL | 2 refills | Status: DC

## 2022-09-17 NOTE — Telephone Encounter (Signed)
[place] left a message for call back on voicemail stating they have some questions about the PA for Dexilant

## 2022-09-17 NOTE — Telephone Encounter (Signed)
I called the pharmacy and they stated the issue has been resolved and insurance will cover generic Dexilant... Med list updated with the brand Rx removed

## 2022-09-17 NOTE — Addendum Note (Signed)
Addended by: Lurlean Nanny on: 09/17/2022 01:37 PM   Modules accepted: Orders

## 2022-09-23 ENCOUNTER — Encounter: Payer: Self-pay | Admitting: Physician Assistant

## 2022-09-23 ENCOUNTER — Ambulatory Visit: Payer: No Typology Code available for payment source | Admitting: Physician Assistant

## 2022-09-23 VITALS — BP 159/84 | HR 68 | Wt 159.6 lb

## 2022-09-23 DIAGNOSIS — R300 Dysuria: Secondary | ICD-10-CM

## 2022-09-23 DIAGNOSIS — N3000 Acute cystitis without hematuria: Secondary | ICD-10-CM | POA: Diagnosis not present

## 2022-09-23 LAB — POCT URINALYSIS DIPSTICK
Bilirubin, UA: NEGATIVE
Blood, UA: NEGATIVE
Glucose, UA: NEGATIVE
Ketones, UA: NEGATIVE
Leukocytes, UA: NEGATIVE
Nitrite, UA: NEGATIVE
Protein, UA: NEGATIVE
Spec Grav, UA: 1.01 (ref 1.010–1.025)
Urobilinogen, UA: 0.2 E.U./dL
pH, UA: 6 (ref 5.0–8.0)

## 2022-09-23 MED ORDER — PHENAZOPYRIDINE HCL 200 MG PO TABS: 200.0000 mg | ORAL_TABLET | Freq: Three times a day (TID) | ORAL | 0 refills | Status: DC | PRN

## 2022-09-23 NOTE — Progress Notes (Signed)
I,Sha'taria Tyson,acting as a Education administrator for [employer], PA-C.,have documented all relevant documentation on the behalf of [name], PA-C,as directed by  [name], PA-C while in the presence of [name], PA-C.   Established patient visit   Patient: Teresa Torres   DOB: Apr 25, 1951   71 y.o. Female  MRN: [id number] Visit Date: 09/23/2022  Today's healthcare provider: [name], PA-C   Cc. dysuria  Subjective     Maanvi reports her UTI symptoms resolved from last month, and 4 days ago she suddenly had severe 8/10 dysuria, at one point saw hematuria, right sided thoracic back pain, lower abdominal pressure, urinary frequency. She has been taking Cystex tablets three times a day with not much relief. Reports history of a bladder prolapse, but has never been evaluated for it.   Medications: Outpatient Medications Prior to Visit  Medication Sig   atorvastatin (LIPITOR) 20 MG tablet Take 1 tablet (20 mg total) by mouth daily.   Azelastine-Fluticasone 137-50 MCG/ACT SUSP Place 1 spray into the nose every 12 (twelve) hours.   cyclobenzaprine (FLEXERIL) 10 MG tablet Take 1 tablet (10 mg total) by mouth 3 (three) times daily as needed for muscle spasms (causes  drowsiness.).   dexlansoprazole (DEXILANT) 60 MG capsule Take 1 capsule (60 mg total) by mouth daily.   losartan (COZAAR) 25 MG tablet Take 1 tablet (25 mg total) by mouth daily.   mupirocin ointment (BACTROBAN) 2 % Apply 1 Application topically daily. With dressing changes   niacin 500 MG tablet Take 500 mg by mouth at bedtime.   triamcinolone cream (KENALOG) 0.1 % Apply 1 application topically 2 (two) times daily.   No facility-administered medications prior to visit.    Review of Systems  Genitourinary:  Positive for frequency.     Objective    Blood pressure (!) 159/84, pulse 68, weight 159 lb 9.6 oz (72.4 kg), SpO2 100 %.   Physical Exam Constitutional:      General: She is awake.     Appearance: She  is well-developed.  HENT:     Head: Normocephalic.  Eyes:     Conjunctiva/sclera: Conjunctivae normal.  Cardiovascular:     Rate and Rhythm: Normal rate and regular rhythm.     Heart sounds: Normal heart sounds.  Pulmonary:     Effort: Pulmonary effort is normal.     Breath sounds: Normal breath sounds.  Abdominal:     General: Abdomen is flat.     Tenderness: There is abdominal tenderness in the suprapubic area. There is no right CVA tenderness or left CVA tenderness.  Skin:    General: Skin is warm.  Neurological:     Mental Status: She is alert and oriented to person, place, and time.  Psychiatric:        Attention and Perception: Attention normal.        Mood and Affect: Mood normal.        Speech: Speech normal.        Behavior: Behavior is cooperative.     Results for orders placed or performed in visit on 09/23/22  POCT Urinalysis Dipstick  Result Value Ref Range   Color, UA     Clarity, UA     Glucose, UA Negative Negative   Bilirubin, UA negative    Ketones, UA negative    Spec Grav, UA 1.010 1.010 - 1.025   Blood, UA negative    pH, UA 6.0 5.0 - 8.0   Protein, UA  Negative Negative   Urobilinogen, UA 0.2 0.2 or 1.0 E.U./dL   Nitrite, UA negative    Leukocytes, UA Negative Negative   Appearance     Odor      Assessment & Plan     Dysuria UA negative, sent for culture Rx pyridium TID for dysuria symptoms, increase fluids Will contact w/ culture results Reviewed recent cmp/cbc Advised ED precautions, call office if symptoms change/worsen.  Discussed possibility of uro/gyn referral to eval prolapse  Return if symptoms worsen or fail to improve.     I, [name], PA-C have reviewed all documentation for this visit. The documentation on  09/23/2022 for the exam, diagnosis, procedures, and orders are all accurate and complete.  [name], PA-C [facility] [address] [place], [place], [address] Office: [phone] Fax:  [place]

## 2022-09-26 LAB — URINE CULTURE

## 2022-09-29 ENCOUNTER — Other Ambulatory Visit: Payer: Self-pay | Admitting: Physician Assistant

## 2022-09-29 DIAGNOSIS — N8110 Cystocele, unspecified: Secondary | ICD-10-CM

## 2022-09-29 DIAGNOSIS — R300 Dysuria: Secondary | ICD-10-CM

## 2022-10-01 ENCOUNTER — Other Ambulatory Visit: Payer: Self-pay | Admitting: Gastroenterology

## 2022-10-02 NOTE — Progress Notes (Signed)
I,Sha'taria Tyson,acting as a Education administrator for [employer], PA-C.,have documented all relevant documentation on the behalf of Teresa Kirschner, PA-C,as directed by  Teresa Kirschner, PA-C while in the presence of Teresa Kirschner, PA-C.   Established patient visit   Patient: Teresa Torres   DOB: [birth year]   71 y.o. Female  MRN: [id number] Visit Date: 10/03/2022  Today's healthcare provider: [name], PA-C  Cc. Htn f/u  Subjective    HPI   Pt reports her urinary symptoms have resolved but she continues to have right sided mid back pain. Denies hematuria, fevers, chills, nausea.  Hypertension, follow-up  BP Readings from Last 3 Encounters:  10/03/22 135/75  09/23/22 (!) 159/84  09/05/22 (!) 151/68   Wt Readings from Last 3 Encounters:  10/03/22 157 lb (71.2 kg)  09/23/22 159 lb 9.6 oz (72.4 kg)  09/05/22 156 lb 1.6 oz (70.8 kg)     She was last seen for hypertension 4 weeks ago.  BP at that visit was 151/68. Management since that visit includes switch to losartan 25 mg.  She reports excellent compliance with treatment. She is not having side effects.  She is following a Regular diet. She is exercising. She does not smoke.  Use of agents associated with hypertension: none.   Outside blood pressures are 135/75 Symptoms: No chest pain No chest pressure  No palpitations No syncope  No dyspnea No orthopnea  No paroxysmal nocturnal dyspnea Yes lower extremity edema   Pertinent labs Lab Results  Component Value Date   CHOL 218 (H) 09/05/2022   HDL 71 09/05/2022   LDLCALC 131 (H) 09/05/2022   TRIG 92 09/05/2022   CHOLHDL 3.1 09/05/2022   Lab Results  Component Value Date   NA 142 09/05/2022   K 4.2 09/05/2022   CREATININE 0.96 09/05/2022   EGFR 64 09/05/2022   GLUCOSE 95 09/05/2022   TSH 2.140 09/05/2022     The 10-year ASCVD risk score (Arnett DK, et al., 2019) is:  12.2%  ---------------------------------------------------------------------------------------------------   Medications: Outpatient Medications Prior to Visit  Medication Sig   atorvastatin (LIPITOR) 20 MG tablet Take 1 tablet (20 mg total) by mouth daily.   Azelastine-Fluticasone 137-50 MCG/ACT SUSP Place 1 spray into the nose every 12 (twelve) hours.   dexlansoprazole (DEXILANT) 60 MG capsule Take 1 capsule (60 mg total) by mouth daily.   losartan (COZAAR) 25 MG tablet Take 1 tablet (25 mg total) by mouth daily.   mupirocin ointment (BACTROBAN) 2 % Apply 1 Application topically daily. With dressing changes   niacin 500 MG tablet Take 500 mg by mouth at bedtime.   phenazopyridine (PYRIDIUM) 200 MG tablet Take 1 tablet (200 mg total) by mouth 3 (three) times daily as needed for pain.   triamcinolone cream (KENALOG) 0.1 % Apply 1 application topically 2 (two) times daily.   [DISCONTINUED] cyclobenzaprine (FLEXERIL) 10 MG tablet Take 1 tablet (10 mg total) by mouth 3 (three) times daily as needed for muscle spasms (causes  drowsiness.).   No facility-administered medications prior to visit.    Review of Systems  Constitutional:  Negative for fatigue and fever.  Respiratory:  Negative for cough and shortness of breath.   Cardiovascular:  Negative for chest pain and leg swelling.  Gastrointestinal:  Negative for abdominal pain.  Musculoskeletal:  Positive for back pain.  Neurological:  Negative for dizziness and headaches.      Objective    Blood pressure 135/75, pulse 66, weight 157 lb (71.2 kg),  SpO2 100 %.   Physical Exam Vitals reviewed.  Constitutional:      Appearance: She is not ill-appearing.  HENT:     Head: Normocephalic.  Eyes:     Conjunctiva/sclera: Conjunctivae normal.  Cardiovascular:     Rate and Rhythm: Normal rate.  Pulmonary:     Effort: Pulmonary effort is normal. No respiratory distress.  Neurological:     General: No focal deficit present.     Mental  Status: She is alert and oriented to person, place, and time.  Psychiatric:        Mood and Affect: Mood normal.        Behavior: Behavior normal.      No results found for any visits on 10/03/22.  Assessment & Plan     Problem List Items Addressed This Visit       Cardiovascular and Mediastinum   Primary hypertension    Elevated in office but normal range at home  Continue medications F/u 6 mo        2. Back pain If urinary symptoms return advised f/u, could do xray kub to r/o kidney stone Advised to monitor for continued pain, hematuria, fevers, nausea  Return in about 6 months (around 04/04/2023) for CPE.      I, Teresa Kirschner, PA-C have reviewed all documentation for this visit. The documentation on  10/03/2022 for the exam, diagnosis, procedures, and orders are all accurate and complete.  Teresa Kirschner, PA-C [facility] [address] [place], [place], [address] Office: [phone] Fax: [place]

## 2022-10-03 ENCOUNTER — Encounter: Payer: Self-pay | Admitting: Physician Assistant

## 2022-10-03 ENCOUNTER — Ambulatory Visit: Payer: No Typology Code available for payment source | Admitting: Physician Assistant

## 2022-10-03 DIAGNOSIS — I10 Essential (primary) hypertension: Secondary | ICD-10-CM

## 2022-10-03 NOTE — Assessment & Plan Note (Signed)
Elevated in office but normal range at home  Continue medications F/u 6 mo

## 2023-01-01 ENCOUNTER — Other Ambulatory Visit: Payer: Self-pay | Admitting: Gastroenterology

## 2023-01-09 ENCOUNTER — Encounter: Payer: Self-pay | Admitting: Physician Assistant

## 2023-01-09 ENCOUNTER — Ambulatory Visit: Payer: No Typology Code available for payment source | Admitting: Physician Assistant

## 2023-01-09 VITALS — BP 169/80 | HR 66 | Resp 16 | Ht 62.0 in | Wt 160.5 lb

## 2023-01-09 DIAGNOSIS — J0110 Acute frontal sinusitis, unspecified: Secondary | ICD-10-CM

## 2023-01-09 MED ORDER — AMOXICILLIN 875 MG PO TABS: 875.0000 mg | ORAL_TABLET | Freq: Two times a day (BID) | ORAL | 0 refills | Status: AC

## 2023-01-09 NOTE — Progress Notes (Signed)
I,Joseline E Rosas,acting as a scribe for [employer], PA-C.,have documented all relevant documentation on the behalf of [name], PA-C,as directed by  [name], PA-C while in the presence of [name], PA-C.   Established patient visit   Patient: Teresa Torres   DOB: 1951-05-18   72 y.o. Female  MRN: [id number] Visit Date: 01/09/2023  Today's healthcare provider: [name], PA-C   Chief Complaint  Patient presents with   URI   Subjective    HPI  Upper respiratory symptoms She complains of nasal congestion, no  fever, post nasal drip, sinus pressure, and sneezing.with no fever, chills, night sweats or weight loss. Onset of symptoms was about a month ago and gradually worsening.She is drinking plenty of fluids.  Past history is significant for no history of pneumonia or bronchitis. Patient is former smoker, quit 16 years ago. Patient has done [employer] system, nasal spray and humidifier .   Medications: Outpatient Medications Prior to Visit  Medication Sig   atorvastatin (LIPITOR) 20 MG tablet Take 1 tablet (20 mg total) by mouth daily.   Azelastine-Fluticasone 137-50 MCG/ACT SUSP Place 1 spray into the nose every 12 (twelve) hours.   dexlansoprazole (DEXILANT) 60 MG capsule Take 1 capsule (60 mg total) by mouth daily.   losartan (COZAAR) 25 MG tablet Take 1 tablet (25 mg total) by mouth daily.   niacin 500 MG tablet Take 500 mg by mouth at bedtime.   phenazopyridine (PYRIDIUM) 200 MG tablet Take 1 tablet (200 mg total) by mouth 3 (three) times daily as needed for pain.   [DISCONTINUED] mupirocin ointment (BACTROBAN) 2 % Apply 1 Application topically daily. With dressing changes   [DISCONTINUED] triamcinolone cream (KENALOG) 0.1 % Apply 1 application topically 2 (two) times daily.   No facility-administered medications prior to visit.    Review of Systems  Constitutional:  Positive for fatigue. Negative for fever.  HENT:  Positive for congestion,  postnasal drip, rhinorrhea, sinus pressure, sinus pain and sneezing.   Respiratory:  Negative for cough and shortness of breath.   Cardiovascular:  Negative for chest pain and leg swelling.  Gastrointestinal:  Negative for abdominal pain.  Neurological:  Positive for headaches. Negative for dizziness.      Objective    BP (!) 169/80 (BP Location: Left Arm, Patient Position: Sitting, Cuff Size: Large)   Pulse 66   Resp 16   Ht '5\' 2"'$  (1.575 m)   Wt 160 lb 8 oz (72.8 kg)   SpO2 100%   BMI 29.36 kg/m    Physical Exam Vitals reviewed.  Constitutional:      Appearance: She is not ill-appearing.  HENT:     Head: Normocephalic.     Right Ear: Tympanic membrane normal.     Left Ear: Tympanic membrane normal.     Nose: Congestion present.     Mouth/Throat:     Pharynx: Posterior oropharyngeal erythema present. No oropharyngeal exudate.  Eyes:     Conjunctiva/sclera: Conjunctivae normal.  Cardiovascular:     Rate and Rhythm: Normal rate.  Pulmonary:     Effort: Pulmonary effort is normal. No respiratory distress.  Neurological:     General: No focal deficit present.     Mental Status: She is alert and oriented to person, place, and time.  Psychiatric:        Mood and Affect: Mood normal.        Behavior: Behavior normal.     No results found for any  visits on 01/09/23.  Assessment & Plan     Acute sinusitis Continue otc therapies and nasal sprays  Rx amoxicillin 875 mg bid x 7 days   Return if symptoms worsen or fail to improve.      I, [name], PA-C have reviewed all documentation for this visit. The documentation on  01/09/23 for the exam, diagnosis, procedures, and orders are all accurate and complete.  [name], PA-C [facility] [address] [place], [place], [address] Office: [phone] Fax: [place]

## 2023-02-16 ENCOUNTER — Encounter: Payer: Self-pay | Admitting: *Deleted

## 2023-03-02 ENCOUNTER — Other Ambulatory Visit: Payer: Self-pay | Admitting: Physician Assistant

## 2023-03-02 DIAGNOSIS — I10 Essential (primary) hypertension: Secondary | ICD-10-CM

## 2023-03-03 ENCOUNTER — Ambulatory Visit: Payer: No Typology Code available for payment source | Admitting: Obstetrics and Gynecology

## 2023-03-03 ENCOUNTER — Encounter: Payer: Self-pay | Admitting: Obstetrics and Gynecology

## 2023-03-03 VITALS — BP 182/91 | HR 77 | Ht 61.5 in | Wt 161.0 lb

## 2023-03-03 DIAGNOSIS — N993 Prolapse of vaginal vault after hysterectomy: Secondary | ICD-10-CM

## 2023-03-03 DIAGNOSIS — N952 Postmenopausal atrophic vaginitis: Secondary | ICD-10-CM

## 2023-03-03 DIAGNOSIS — N8110 Cystocele, unspecified: Secondary | ICD-10-CM

## 2023-03-03 MED ORDER — ESTRADIOL 0.1 MG/GM VA CREA: 0.5000 g | TOPICAL_CREAM | VAGINAL | 11 refills | Status: DC

## 2023-03-03 NOTE — Progress Notes (Signed)
[facility] New Patient Evaluation and Consultation  Referring Provider: [name], PA-C PCP: [name], PA-C Date of Service: 03/03/2023  SUBJECTIVE Chief Complaint: New Patient (Initial Visit) [facility] is a 72 y.o. female here for a consult for prolapse.)  History of Present Illness: [facility] is a 72 y.o. White or Caucasian female seen in consultation at the request of PA [name] for evaluation of prolapse.    Endorses that she was having significantly painful urination with negative urinalysis. She reports she is concerned she may have had some kidney stones as she would see white pieces in her urine.   Describes the pain as pressure and difficulty passing her urine when she has symptoms. Takes Azo complete as needed and drinks cranberry juice daily.   Prolapse is noticeable to patient, never tried PT or pessary.   Review of records significant for: Has dysuria but no urinary tract infections on culture. Has bladder prolapse.  Urinary Symptoms: Does not leak urine.   Day time voids- 6-8.  Nocturia: 1-2 times per night to void. Voiding dysfunction: she does not empty her bladder well.  does not use a catheter to empty bladder.  When urinating, she feels she has no difficulties Drinks: 8oz cranberry juice, one cup of hot tea (Earl Veleta [employer]), [place], and water per day. (Sips all day long)   UTIs:  0  UTI's in the last year.   Denies history of blood in urine and kidney or bladder stones  Pelvic Organ Prolapse Symptoms:                   Admits to a feeling of a bulge the vaginal area. It has been present for 3 years.   Admits to seeing a bulge.  This bulge is bothersome.  Bowel Symptom: Bowel movements: 1 time(s) per day Stool consistency: soft  Straining: no.  Splinting: no.  Incomplete evacuation: no.  Denies accidental bowel leakage / fecal incontinence Bowel regimen: diet   Sexual Function Sexually active: no.   Pelvic  Pain Denies pelvic pain   Past Medical History:  Past Medical History:  Diagnosis Date   Arthritis    of spine   Basal cell carcinoma 04/23/2022   Right low back above waistline, EDC   Hiatal hernia    Hyperlipidemia      Past Surgical History:   Past Surgical History:  Procedure Laterality Date   ABDOMINAL HYSTERECTOMY  1984   Partial   BREAST BIOPSY Right 1972   EXCISIONAL - NEG   CHOLECYSTECTOMY  2007-2008   [place]     Past OB/GYN History: OB History     Gravida  1   Para  1   Term  1   Preterm      AB      Living  1      SAB      IAB      Ectopic      Multiple      Live Births  1           Vaginal deliveries: 1,  Forceps/ Vacuum deliveries: 0, Cesarean section: 0 S/p hysterectomy   Medications: She has a current medication list which includes the following prescription(s): atorvastatin, azelastine-fluticasone, dexlansoprazole, [START ON 03/05/2023] estradiol, losartan, and niacin.   Allergies: Patient is allergic to compazine [prochlorperazine edisylate], lisinopril, aspirin, and nitrofurantoin.   Social History:  Social History   Tobacco  Use   Smoking status: Former   Smokeless tobacco: Never  Vaping Use   Vaping Use: Never used  Substance Use Topics   Alcohol use: Yes   Drug use: Never    Relationship status: married She lives with husband.   She is employed as an Teacher, music. Regular exercise: Yes: walking History of abuse: No  Family History:   Family History  Problem Relation Age of Onset   Hypertension Mother    Heart disease Mother    Breast cancer Paternal Grandmother      Review of Systems: Review of Systems  Constitutional:  Negative for fever, malaise/fatigue and weight loss.  Respiratory:  Negative for cough, shortness of breath and wheezing.   Cardiovascular:  Positive for palpitations and leg swelling. Negative for chest pain.  Gastrointestinal:   Negative for abdominal pain and blood in stool.  Genitourinary:  Negative for dysuria.  Musculoskeletal:  Negative for myalgias.  Skin:  Negative for rash.  Neurological:  Negative for dizziness and headaches.  Endo/Heme/Allergies:  Bruises/bleeds easily.  Psychiatric/Behavioral:  Negative for depression. The patient is not nervous/anxious.      OBJECTIVE Physical Exam: Vitals:   03/03/23 1318 03/03/23 1408  BP: (!) 175/101 (!) 182/91  Pulse: 72 77  Weight: 161 lb (73 kg)   Height: 5' 1.5" (1.562 m)     Physical Exam Constitutional:      General: She is not in acute distress. Pulmonary:     Effort: Pulmonary effort is normal.  Abdominal:     General: There is no distension.     Palpations: Abdomen is soft.     Tenderness: There is no abdominal tenderness. There is no rebound.  Musculoskeletal:        General: No swelling. Normal range of motion.  Skin:    General: Skin is warm and dry.     Findings: No rash.  Neurological:     Mental Status: She is alert and oriented to person, place, and time.  Psychiatric:        Mood and Affect: Mood normal.        Behavior: Behavior normal.      GU / Detailed Urogynecologic Evaluation:  Pelvic Exam: Normal external female genitalia; Bartholin's and Skene's glands normal in appearance; urethral meatus normal in appearance, no urethral masses or discharge.   CST: negative  s/p hysterectomy: Speculum exam reveals normal vaginal mucosa with  atrophy and normal vaginal cuff.  Adnexa no mass, fullness, tenderness.    Pelvic floor strength I/V  Pelvic floor musculature: Right levator non-tender, Right obturator non-tender, Left levator non-tender, Left obturator non-tender  POP-Q:   POP-Q  -1                                            Aa   -1                                           Ba  -5                                              C   3  Gh  3.5                                             Pb  7.5                                            tvl   -2.5                                            Ap  -2.5                                            Bp                                                 D      Rectal Exam:  Normal external rectum  Post-Void Residual (PVR) by Bladder Scan: In order to evaluate bladder emptying, we discussed obtaining a postvoid residual and she agreed to this procedure.  Procedure: The ultrasound unit was placed on the patient's abdomen in the suprapubic region after the patient had voided. A PVR of 0 ml was obtained by bladder scan.  Laboratory Results: POC urine: negative   ASSESSMENT AND PLAN Ms. Griffon is a 71 y.o. with:  1. Prolapse of anterior vaginal wall   2. Vaginal vault prolapse after hysterectomy   3. Vaginal atrophy    Stage II anterior, Stage I posterior, Stage I apical prolapse - For treatment of pelvic organ prolapse, we discussed options for management including expectant management, conservative management, and surgical management, such as Kegels, a pessary, pelvic floor physical therapy, and specific surgical procedures. - She is not interested in an intervention at this time and would like to expectantly manage - We discussed avoiding heavy lifting or strenuous activity to prevent progression of prolapse  2. Vaginal atrophy - We discussed starting vaginal estrogen cream to help with any discomfort from dryness and also to prevent urinary tract infections.  - Prescribed estrace 0.5g nightly for two weeks then twice a week after  Return as needed  Jaquita Folds, MD

## 2023-03-03 NOTE — Patient Instructions (Signed)
You have a stage 2 (out of 4) prolapse.  We discussed the fact that it is not life threatening but there are several treatment options. For treatment of pelvic organ prolapse, we discussed options for management including expectant management, conservative management, and surgical management, such as Kegels, a pessary, pelvic floor physical therapy, and specific surgical procedures.     Start vaginal estrogen therapy nightly for two weeks then 2 times weekly at night for treatment of vaginal atrophy (dryness of the vaginal tissues).  Please let us know if the prescription is too expensive and we can look for alternative options.

## 2023-04-03 ENCOUNTER — Ambulatory Visit (INDEPENDENT_AMBULATORY_CARE_PROVIDER_SITE_OTHER): Payer: No Typology Code available for payment source | Admitting: Physician Assistant

## 2023-04-03 ENCOUNTER — Encounter: Payer: Self-pay | Admitting: Physician Assistant

## 2023-04-03 VITALS — BP 135/74 | HR 68 | Ht 61.5 in | Wt 162.7 lb

## 2023-04-03 DIAGNOSIS — Z0000 Encounter for general adult medical examination without abnormal findings: Secondary | ICD-10-CM | POA: Diagnosis not present

## 2023-04-03 DIAGNOSIS — Z1231 Encounter for screening mammogram for malignant neoplasm of breast: Secondary | ICD-10-CM

## 2023-04-03 DIAGNOSIS — E782 Mixed hyperlipidemia: Secondary | ICD-10-CM | POA: Diagnosis not present

## 2023-04-03 DIAGNOSIS — I10 Essential (primary) hypertension: Secondary | ICD-10-CM

## 2023-04-03 DIAGNOSIS — M8580 Other specified disorders of bone density and structure, unspecified site: Secondary | ICD-10-CM | POA: Diagnosis not present

## 2023-04-03 DIAGNOSIS — M25522 Pain in left elbow: Secondary | ICD-10-CM

## 2023-04-03 NOTE — Patient Instructions (Signed)
Please contact [phone] to schedule your mammogram. You will be asked your location preference to have procedure performed. You have two options listed below.  1) [facility] located at [address] [place], [place] [address] 2) [facility] [place] located at [address] [place], [place] [address]  Upon results being received our office will contact you. As well as all results can be viewed through your MyChart. Please feel free to contact us if you have any further questions or concerns.   

## 2023-04-03 NOTE — Progress Notes (Signed)
I,Sha'taria Tyson,acting as a Neurosurgeonscribe for Eastman KodakLindsay Deloyd Handy, PA-C.,have documented all relevant documentation on the behalf of Alfredia FergusonLindsay Lynesha Bango, PA-C,as directed by  Alfredia FergusonLindsay Wasyl Dornfeld, PA-C while in the presence of Alfredia FergusonLindsay Matasha Smigelski, PA-C.   Complete physical exam   Patient: Teresa Torres   DOB: 12/04/1951   72 y.o. Female  MRN: [id number] Visit Date: 04/03/2023  Today's healthcare provider: Alfredia FergusonLindsay Yamilee Harmes, PA-C   Cc. cpe  Subjective    Teresa Reddishnn G Edling is a 72 y.o. female who presents today for a complete physical exam.  She reports consuming a general diet.  The patient reports trying to walk at least a mile a day.  She generally feels well. She reports sleeping well. She does not have additional problems to discuss today.  HPI   Estrogen cream -Pt was recently rx by urogyn. She has some questions as OCPs and other hormonal estrogen has given her headaches before.   Left arm elbow pain  -A nerve/burning pain sometimes in her left elbow. Sometimes aching at night.   Past Medical History:  Diagnosis Date   Allergy 1980   See chart   Arthritis    of spine   Basal cell carcinoma 04/23/2022   Right low back above waistline, EDC   GERD (gastroesophageal reflux disease) 1986   See chart   Hiatal hernia    Hyperlipidemia    Hypertension 2022   See chart   Past Surgical History:  Procedure Laterality Date   ABDOMINAL HYSTERECTOMY  1984   Partial   BREAST BIOPSY Right 1972   EXCISIONAL - NEG   CHOLECYSTECTOMY  2007-2008   KNEE SURGERY  1980   TONSILLECTOMY  1961   Social History   Socioeconomic History   Marital status: Married    Spouse name: Kimber RelicJohn Popelka   Number of children: Not on file   Years of education: Not on file   Highest education level: Not on file  Occupational History   Not on file  Tobacco Use   Smoking status: Former   Smokeless tobacco: Never  Vaping Use   Vaping Use: Never used  Substance and Sexual Activity   Alcohol use: Yes    Alcohol/week: 6.0  standard drinks of alcohol    Types: 6 Glasses of wine per week   Drug use: Never   Sexual activity: Not Currently    Partners: Male  Other Topics Concern   Not on file  Social History Narrative   Not on file   Social Determinants of Health   Financial Resource Strain: Not on file  Food Insecurity: Not on file  Transportation Needs: Not on file  Physical Activity: Not on file  Stress: Not on file  Social Connections: Not on file  Intimate Partner Violence: Not on file   Family Status  Relation Name Status   Mother Mayme Gentadna Hazelwood Deceased at age 70       Heart Disease   Father  Deceased at age 70       Died from a motor vehicle   Brother  Deceased       died from a motor vehicle accident   Son  Alive       strokes   [employer]  (Not Specified)   [employer] Clar[employer] Gladdenelia Vaughn (Not Specified)   Family History  Problem Relation Age of Onset   Hypertension Mother    Heart disease Mother    Arthritis Mother    Miscarriages / India[place]births Mother    Breast cancer  Paternal Grandmother    Diabetes Maternal Grandmother    Allergies  Allergen Reactions   Compazine [Prochlorperazine Edisylate] Anaphylaxis   Lisinopril Cough   Aspirin Nausea And Vomiting   Nitrofurantoin Nausea And Vomiting    Patient Care Team: Alfredia Ferguson, PA-C as PCP - General (Physician Assistant)   Medications: Outpatient Medications Prior to Visit  Medication Sig   atorvastatin (LIPITOR) 20 MG tablet Take 1 tablet (20 mg total) by mouth daily.   Azelastine-Fluticasone 137-50 MCG/ACT SUSP Place 1 spray into the nose every 12 (twelve) hours.   dexlansoprazole (DEXILANT) 60 MG capsule Take 1 capsule (60 mg total) by mouth daily.   losartan (COZAAR) 25 MG tablet TAKE 1 TABLET BY MOUTH EVERY DAY   niacin 500 MG tablet Take 500 mg by mouth at bedtime.   estradiol (ESTRACE) 0.1 MG/GM vaginal cream Place 0.5 g vaginally 2 (two) times a week. Place 0.5g nightly for two weeks then twice a week after (Patient not taking:  Reported on 04/03/2023)   No facility-administered medications prior to visit.    Review of Systems  Constitutional: Negative.   HENT:  Positive for postnasal drip, sneezing and tinnitus.   Eyes:  Positive for redness and itching.  Respiratory:  Positive for shortness of breath.   Cardiovascular:  Positive for palpitations and leg swelling.  Gastrointestinal: Negative.   Endocrine: Negative.   Genitourinary: Negative.   Musculoskeletal:  Positive for arthralgias and myalgias.  Skin: Negative.   Allergic/Immunologic: Negative.   Neurological: Negative.   Hematological: Negative.   Psychiatric/Behavioral: Negative.      Objective    BP 135/74 Comment: home value earlier this week  Pulse 68   Ht 5' 1.5" (1.562 m)   Wt 162 lb 11.2 oz (73.8 kg)   SpO2 100%   BMI 30.24 kg/m    Physical Exam Constitutional:      General: She is awake.     Appearance: She is well-developed. She is not ill-appearing.  HENT:     Head: Normocephalic.     Right Ear: Tympanic membrane normal.     Left Ear: Tympanic membrane normal.     Nose: Nose normal. No congestion or rhinorrhea.     Mouth/Throat:     Pharynx: No oropharyngeal exudate or posterior oropharyngeal erythema.  Eyes:     Conjunctiva/sclera: Conjunctivae normal.     Pupils: Pupils are equal, round, and reactive to light.  Neck:     Thyroid: No thyroid mass or thyromegaly.  Cardiovascular:     Rate and Rhythm: Normal rate and regular rhythm.     Heart sounds: Normal heart sounds.  Pulmonary:     Effort: Pulmonary effort is normal.     Breath sounds: Normal breath sounds.  Abdominal:     Palpations: Abdomen is soft.     Tenderness: There is no abdominal tenderness.  Musculoskeletal:     Right lower leg: No swelling. No edema.     Left lower leg: No swelling. No edema.  Lymphadenopathy:     Cervical: No cervical adenopathy.  Skin:    General: Skin is warm.  Neurological:     Mental Status: She is alert and oriented to  person, place, and time.  Psychiatric:        Attention and Perception: Attention normal.        Mood and Affect: Mood normal.        Speech: Speech normal.        Behavior: Behavior normal. Behavior is  cooperative.     Last depression screening scores    04/03/2023    8:52 AM 01/09/2023    9:44 AM 09/05/2022    8:58 AM  PHQ 2/9 Scores  PHQ - 2 Score 0 0 0  PHQ- 9 Score 1 2 3    Last fall risk screening    04/03/2023    8:52 AM  Fall Risk   Falls in the past year? 0  Number falls in past yr: 0  Injury with Fall? 0  Risk for fall due to : No Fall Risks  Follow up Falls evaluation completed   Last Audit-C alcohol use screening    01/09/2023    9:45 AM  Alcohol Use Disorder Test (AUDIT)  1. How often do you have a drink containing alcohol? 3  2. How many drinks containing alcohol do you have on a typical day when you are drinking? 0  3. How often do you have six or more drinks on one occasion? 0  AUDIT-C Score 3  4. How often during the last year have you found that you were not able to stop drinking once you had started? 0  5. How often during the last year have you failed to do what was normally expected from you because of drinking? 0  6. How often during the last year have you needed a first drink in the morning to get yourself going after a heavy drinking session? 0  7. How often during the last year have you had a feeling of guilt of remorse after drinking? 0  8. How often during the last year have you been unable to remember what happened the night before because you had been drinking? 0  9. Have you or someone else been injured as a result of your drinking? 0  10. Has a relative or friend or a doctor or another health worker been concerned about your drinking or suggested you cut down? 0  Alcohol Use Disorder Identification Test Final Score (AUDIT) 3   A score of 3 or more in women, and 4 or more in men indicates increased risk for alcohol abuse, EXCEPT if all of the points  are from question 1   No results found for any visits on 04/03/23.  Assessment & Plan    Routine Health Maintenance and Physical Exam  Exercise Activities and Dietary recommendations --balanced diet high in fiber and protein, low in sugars, carbs, fats. --physical activity/exercise 30 minutes 3-5 times a week     Immunization History  Administered Date(s) Administered   Fluad Quad(high Dose 65+) 09/14/2020, 10/18/2021, 09/05/2022   Influenza, High Dose Seasonal PF 10/17/2013   Influenza,inj,Quad PF,6+ Mos 10/30/2016   Influenza-Unspecified 10/31/2017   PFIZER(Purple Top)SARS-COV-2 Vaccination 02/24/2020, 03/21/2020, 11/16/2020   Pfizer Covid-19 Vaccine Bivalent Booster 84yrs & up 10/24/2021   Tdap 10/17/2013    Health Maintenance  Topic Date Due   COVID-19 Vaccine (5 - 2023-24 season) 08/29/2022   Zoster Vaccines- Shingrix (1 of 2) 04/10/2023 (Originally 08/29/1970)   Pneumonia Vaccine 37+ Years old (1 of 1 - PCV) 01/10/2024 (Originally 08/29/2018)   MAMMOGRAM  04/12/2023   INFLUENZA VACCINE  07/30/2023   DTaP/Tdap/Td (2 - Td or Tdap) 10/18/2023   COLONOSCOPY (Pts 45-50yrs Insurance coverage will need to be confirmed)  12/06/2023   DEXA SCAN  Completed   Hepatitis C Screening  Completed   HPV VACCINES  Aged Out    Discussed health benefits of physical activity, and encouraged her to engage in  regular exercise appropriate for her age and condition.  Problem List Items Addressed This Visit       Cardiovascular and Mediastinum   Primary hypertension    Elevated in office and more elevated at home  Discussed continuing to monitor at home If >140/90 to double losartan 25 to 50 mg. F/u earlier if needed      Relevant Orders   Comprehensive Metabolic Panel (CMET)     Other   HYPERLIPIDEMIA, MIXED    Managed with lipitor 20 mg  Repeat fasting lipids LDL goal < 100      Relevant Orders   Comprehensive Metabolic Panel (CMET)   Lipid Profile   Other Visit Diagnoses      Annual physical exam    -  Primary   Osteopenia, unspecified location       Relevant Orders   DG Bone Density   Left elbow pain       Breast cancer screening by mammogram       Relevant Orders   MM 3D SCREENING MAMMOGRAM BILATERAL BREAST      L elbow pain  -advised ice, compression sleeve.   Estrogen therapy -reassured topical estrogen is a far lower dose -if she tries and gets headaches, to just stop.  Return in about 6 months (around 10/03/2023) for or earlier if needed.     I, Alfredia FergusonLindsay Guy Seese, PA-C have reviewed all documentation for this visit. The documentation on 04/03/23 for the exam, diagnosis, procedures, and orders are all accurate and complete.  Alfredia FergusonLindsay Sanad Fearnow, PA-C [facility] [address] [place], [place], [id number] Office: [phone] Fax: [phone]   [facility]

## 2023-04-03 NOTE — Assessment & Plan Note (Signed)
Elevated in office and more elevated at home  Discussed continuing to monitor at home If >140/90 to double losartan 25 to 50 mg. F/u earlier if needed

## 2023-04-03 NOTE — Assessment & Plan Note (Signed)
Managed with lipitor 20 mg  Repeat fasting lipids LDL goal < 100

## 2023-04-04 LAB — COMPREHENSIVE METABOLIC PANEL
ALT: 18 IU/L (ref 0–32)
AST: 22 IU/L (ref 0–40)
Albumin/Globulin Ratio: 2 (ref 1.2–2.2)
Albumin: 4.3 g/dL (ref 3.9–4.9)
Alkaline Phosphatase: 73 IU/L (ref 44–121)
BUN/Creatinine Ratio: 16 (ref 12–28)
BUN: 14 mg/dL (ref 8–27)
Bilirubin Total: 0.4 mg/dL (ref 0.0–1.2)
CO2: 24 mmol/L (ref 20–29)
Calcium: 9.8 mg/dL (ref 8.7–10.3)
Chloride: 106 mmol/L (ref 96–106)
Creatinine, Ser: 0.88 mg/dL (ref 0.57–1.00)
Globulin, Total: 2.1 g/dL (ref 1.5–4.5)
Glucose: 90 mg/dL (ref 70–99)
Potassium: 4.1 mmol/L (ref 3.5–5.2)
Sodium: 146 mmol/L — ABNORMAL HIGH (ref 134–144)
Total Protein: 6.4 g/dL (ref 6.0–8.5)
eGFR: 71 mL/min/{1.73_m2} (ref >59)

## 2023-04-04 LAB — LIPID PANEL
Chol/HDL Ratio: 2.4 ratio (ref 0.0–4.4)
Cholesterol, Total: 183 mg/dL (ref 100–199)
HDL: 75 mg/dL (ref >39)
LDL Chol Calc (NIH): 94 mg/dL (ref 0–99)
Triglycerides: 79 mg/dL (ref 0–149)
VLDL Cholesterol Cal: 14 mg/dL (ref 5–40)

## 2023-06-19 ENCOUNTER — Ambulatory Visit
Admission: RE | Admit: 2023-06-19 | Discharge: 2023-06-19 | Disposition: A | Discharge status: Home / Self Care | Payer: No Typology Code available for payment source | Source: Ambulatory Visit | Attending: Physician Assistant | Admitting: Physician Assistant

## 2023-06-19 DIAGNOSIS — M8580 Other specified disorders of bone density and structure, unspecified site: Secondary | ICD-10-CM | POA: Diagnosis not present

## 2023-06-19 DIAGNOSIS — Z1231 Encounter for screening mammogram for malignant neoplasm of breast: Secondary | ICD-10-CM

## 2023-07-09 ENCOUNTER — Other Ambulatory Visit: Payer: Self-pay | Admitting: Gastroenterology

## 2023-07-16 ENCOUNTER — Ambulatory Visit: Payer: No Typology Code available for payment source | Admitting: Dermatology

## 2023-08-14 ENCOUNTER — Telehealth: Payer: Self-pay | Admitting: General Practice

## 2023-08-14 ENCOUNTER — Ambulatory Visit
Admission: EM | Admit: 2023-08-14 | Discharge: 2023-08-14 | Disposition: A | Discharge status: Home / Self Care | Payer: No Typology Code available for payment source | Attending: Urgent Care | Admitting: Urgent Care

## 2023-08-14 DIAGNOSIS — L237 Allergic contact dermatitis due to plants, except food: Secondary | ICD-10-CM

## 2023-08-14 MED ORDER — PREDNISONE 10 MG PO TABS: ORAL_TABLET | ORAL | 0 refills | Status: DC

## 2023-08-14 NOTE — ED Notes (Signed)
Triaged by provider  

## 2023-08-14 NOTE — Discharge Instructions (Addendum)
A corticosteroid medication, prednisone, has been prescribed for your rash which has been diagnosed as allergic contact dermatitis.  For groin rash:  Twice a day-  - Wash gently with soap and water.  Rinse and dry completely. - Apply clotrimazole cream to the affected area in your groin.  Rub in completely and allow to dry completely. - Apply medicated powder.  Apply more medicated powder to ensure dryness throughout the day, each time you urinate.  Return for treatment if your symptoms do not resolve.

## 2023-08-14 NOTE — ED Provider Notes (Signed)
Renaldo Fiddler    CSN: [id number] Arrival date & time: 08/14/23  1357      History   Chief Complaint No chief complaint on file.   HPI Teresa Torres is a 72 y.o. female.   HPI  Triaged by provider.  Presents with complaint of rash x 5 days.  Patient states she was outside gardening about a week ago and developed groin rash afterward.  Then noticed lesions on her lower leg, abdomen, chest/breast a few days later.  She states she knows what poison ivy/oak look like and did not see any in the area where she was gardening.   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   Primary hypertension 07/30/2015   Arteriosclerosis of coronary artery 11/17/2014   Acid reflux 11/17/2014   Palpitations 11/17/2014   HYPERLIPIDEMIA, MIXED 09/03/2009    Past Surgical History:  Procedure Laterality Date   ABDOMINAL HYSTERECTOMY  1984   Partial   BREAST BIOPSY Right 1972   EXCISIONAL - NEG   CHOLECYSTECTOMY  2007-2008   KNEE SURGERY  1980   TONSILLECTOMY  1961    OB History     Gravida  1   Para  1   Term  1   Preterm      AB      Living  1      SAB      IAB      Ectopic      Multiple      Live Births  1            Home Medications    Prior to Admission medications   Medication Sig Start Date End Date Taking? Authorizing Provider  atorvastatin (LIPITOR) 20 MG tablet Take 1 tablet (20 mg total) by mouth daily. 09/08/22   Alfredia Ferguson, PA-C  Azelastine-Fluticasone 137-50 MCG/ACT SUSP Place 1 spray  into the nose every 12 (twelve) hours. 11/29/21   Alfredia Ferguson, PA-C  dexlansoprazole (DEXILANT) 60 MG capsule Take 1 capsule (60 mg total) by mouth daily. MUST SCHEDULE OFFICE VISIT 07/09/23   [name], MD  estradiol (ESTRACE) 0.1 MG/GM vaginal cream Place 0.5 g vaginally 2 (two) times a week. Place 0.5g nightly for two weeks then twice a week after Patient not taking: Reported on 04/03/2023 03/05/23   Marguerita Beards, MD  losartan (COZAAR) 25 MG tablet TAKE 1 TABLET BY MOUTH EVERY DAY 03/02/23   Alfredia Ferguson, PA-C  niacin 500 MG tablet Take 500 mg by mouth at bedtime.    [provider]    Family History Family History  Problem Relation Age of Onset   Hypertension Mother    Heart disease Mother    Arthritis Mother    Miscarriages / [place] Mother    Breast cancer Paternal Grandmother    Diabetes Maternal Grandmother     Social History Social History   Tobacco Use   Smoking status: Former   Smokeless  tobacco: Never  Vaping Use   Vaping status: Never Used  Substance Use Topics   Alcohol use: Yes    Alcohol/week: 6.0 standard drinks of alcohol    Types: 6 Glasses of wine per week   Drug use: Never     Allergies   Compazine [prochlorperazine edisylate], Lisinopril, Aspirin, and Nitrofurantoin   Review of Systems Review of Systems   Physical Exam Triage Vital Signs ED Triage Vitals  Encounter Vitals Group     BP      Systolic BP Percentile      Diastolic BP Percentile      Pulse      Resp      Temp      Temp src      SpO2      Weight      Height      Head Circumference      Peak Flow      Pain Score      Pain Loc      Pain Education      Exclude from Growth Chart    No data found.  Updated Vital Signs There were no vitals taken for this visit.  Visual Acuity Right Eye Distance:   Left Eye Distance:   Bilateral Distance:    Right Eye Near:   Left Eye Near:    Bilateral Near:     Physical Exam Vitals reviewed.   Constitutional:      Appearance: Normal appearance.  Skin:    General: Skin is warm and dry.     Findings: Erythema and rash present.       Neurological:     General: No focal deficit present.     Mental Status: She is alert and oriented to person, place, and time.  Psychiatric:        Mood and Affect: Mood normal.        Behavior: Behavior normal.      UC Treatments / Results  Labs (all labs ordered are listed, but only abnormal results are displayed) Labs Reviewed - No data to display  EKG   Radiology No results found.  Procedures Procedures (including critical care time)  Medications Ordered in UC Medications - No data to display  Initial Impression / Assessment and Plan / UC Course  I have reviewed the triage vital signs and the nursing notes.  Pertinent labs & imaging results that were available during my care of the patient were reviewed by me and considered in my medical decision making (see chart for details).   Teresa Torres is a 72 y.o. female presenting with rash. Patient is afebrile without recent antipyretics, satting well on room air. Overall is well appearing, well hydrated, without respiratory distress.  Rash is present on the patient's right lower leg, left groin  Reviewed relevant chart history.   Suspected intertrigo rash in the left side groin.  Recommending treatment with antifungal topical cream.  Additionally, suspected contact dermatitis in the other areas of her body given the spread and will treat with oral corticosteroid.  Counseled patient on potential for adverse effects with medications prescribed/recommended today, ER and return-to-clinic precautions discussed, patient verbalized understanding and agreement with care plan.  Final Clinical Impressions(s) / UC Diagnoses   Final diagnoses:  None   Discharge Instructions   None    ED Prescriptions   None    PDMP not reviewed this encounter.   Charma Igo, [place] 08/14/23  1521

## 2023-08-17 ENCOUNTER — Ambulatory Visit: Payer: No Typology Code available for payment source | Admitting: Dermatology

## 2023-08-17 DIAGNOSIS — W908XXA Exposure to other nonionizing radiation, initial encounter: Secondary | ICD-10-CM | POA: Diagnosis not present

## 2023-08-17 DIAGNOSIS — Z79899 Other long term (current) drug therapy: Secondary | ICD-10-CM

## 2023-08-17 DIAGNOSIS — Z1283 Encounter for screening for malignant neoplasm of skin: Secondary | ICD-10-CM

## 2023-08-17 DIAGNOSIS — D229 Melanocytic nevi, unspecified: Secondary | ICD-10-CM

## 2023-08-17 DIAGNOSIS — Z85828 Personal history of other malignant neoplasm of skin: Secondary | ICD-10-CM

## 2023-08-17 DIAGNOSIS — L578 Other skin changes due to chronic exposure to nonionizing radiation: Secondary | ICD-10-CM

## 2023-08-17 DIAGNOSIS — R21 Rash and other nonspecific skin eruption: Secondary | ICD-10-CM

## 2023-08-17 DIAGNOSIS — L237 Allergic contact dermatitis due to plants, except food: Secondary | ICD-10-CM

## 2023-08-17 DIAGNOSIS — L821 Other seborrheic keratosis: Secondary | ICD-10-CM

## 2023-08-17 DIAGNOSIS — L2089 Other atopic dermatitis: Secondary | ICD-10-CM

## 2023-08-17 DIAGNOSIS — Z7189 Other specified counseling: Secondary | ICD-10-CM

## 2023-08-17 DIAGNOSIS — L219 Seborrheic dermatitis, unspecified: Secondary | ICD-10-CM

## 2023-08-17 DIAGNOSIS — L209 Atopic dermatitis, unspecified: Secondary | ICD-10-CM

## 2023-08-17 DIAGNOSIS — L814 Other melanin hyperpigmentation: Secondary | ICD-10-CM

## 2023-08-17 DIAGNOSIS — D1801 Hemangioma of skin and subcutaneous tissue: Secondary | ICD-10-CM

## 2023-08-17 MED ORDER — PREDNISONE 5 MG PO TABS: ORAL_TABLET | ORAL | 0 refills | Status: DC

## 2023-08-17 MED ORDER — HYDROCORTISONE 2.5 % EX CREA: TOPICAL_CREAM | CUTANEOUS | 5 refills | Status: AC

## 2023-08-17 MED ORDER — KETOCONAZOLE 2 % EX CREA: TOPICAL_CREAM | CUTANEOUS | 5 refills | Status: DC

## 2023-08-17 NOTE — Progress Notes (Signed)
Follow-Up Visit   Subjective  Teresa Torres is a 72 y.o. female who presents for the following: Skin Cancer Screening and Full Body Skin Exam  Patient c/o itch and scale of the ears that started about a year ago. Used otc lotion which helped some, but then started spreading behind the ears.   Bumps on the neck that started itching she accidentally scratched one off but area has remained red.  Rash in groin that is occasionally itchy, thinks it may be from sweating outside.   The patient presents for Total-Body Skin Exam (TBSE) for skin cancer screening and mole check. The patient has spots, moles and lesions to be evaluated, some may be new or changing and the patient may have concern these could be cancer.   The following portions of the chart were reviewed this encounter and updated as appropriate: medications, allergies, medical history  Review of Systems:  No other skin or systemic complaints except as noted in HPI or Assessment and Plan.  Objective  Well appearing patient in no apparent distress; mood and affect are within normal limits.  A full examination was performed including scalp, head, eyes, ears, nose, lips, neck, chest, axillae, abdomen, back, buttocks, bilateral upper extremities, bilateral lower extremities, hands, feet, fingers, toes, fingernails, and toenails. All findings within normal limits unless otherwise noted below.   Relevant physical exam findings are noted in the Assessment and Plan.    Assessment & Plan   SKIN CANCER SCREENING PERFORMED TODAY.  ACTINIC DAMAGE - Chronic condition, secondary to cumulative UV/sun exposure - diffuse scaly erythematous macules with underlying dyspigmentation - Recommend daily broad spectrum sunscreen SPF 30+ to sun-exposed areas, reapply every 2 hours as needed.  - Staying in the shade or wearing long sleeves, sun glasses (UVA+UVB protection) and wide brim hats (4-inch brim around the entire circumference of the hat) are  also recommended for sun protection.  - Call for new or changing lesions.  LENTIGINES, SEBORRHEIC KERATOSES, HEMANGIOMAS - Benign normal skin lesions - Benign-appearing - Call for any changes  MELANOCYTIC NEVI - Tan-brown and/or pink-flesh-colored symmetric macules and papules - Benign appearing on exam today - Observation - Call clinic for new or changing moles - Recommend daily use of broad spectrum spf 30+ sunscreen to sun-exposed areas.   HISTORY OF BASAL CELL CARCINOMA OF THE SKIN - No evidence of recurrence today - Recommend regular full body skin exams - Recommend daily broad spectrum sunscreen SPF 30+ to sun-exposed areas, reapply every 2 hours as needed.  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 Exam: Pink patches with greasy scale at the face and ears  Chronic and persistent condition with duration or expected duration over one year. Condition is symptomatic/ bothersome to patient. Not currently at goal.  Seborrheic Dermatitis is a chronic persistent rash characterized by pinkness and scaling most commonly of the mid face but also can occur on the scalp (dandruff), ears; mid chest, mid back and groin.  It tends to be exacerbated by stress and cooler weather.  People who have neurologic disease may experience new onset or exacerbation of existing seborrheic dermatitis.  The condition is not curable but treatable and can be controlled.  Treatment Plan: Start HC 2.5% cream QD on M,W,F alternating with Ketoconazole 2% cream QD on T, Th, Sat  Rash Differential diagnosis:  Contact dermatitis to poison ivy Treatment Plan: Discontinue current 1 week Prednisone  taper. Start Prednisone 3 week taper.   Take prednisone by mouth daily in morning with food starting at 60 mg dosage and gradually decreasing daily dose as directed until gone for a three week taper.  Patient was given written  instructions.  Potential side effects reviewed.   Return in about 1 year (around 08/16/2024) for TBSE.  Maylene Roes, CMA, am acting as scribe for Armida Sans, MD .  Documentation: I have reviewed the above documentation for accuracy and completeness, and I agree with the above.  Armida Sans, MD

## 2023-08-17 NOTE — Patient Instructions (Addendum)
3 Week Prednisone Taper  You will be given a prescription for 150 tablets of oral Prednisone. It is very important that you take this according to the exact schedule provided below. This type of regimen for taking medication is often called a "taper", because your dosage will steadily decrease over a two week period until it is discontinued altogether.  ALWAYS take this medicine with food to prevent it from irritating your stomach. You should also take your Prednisone during morning hours.  Call the clinic at [phone] if you gain more than two pounds in one day, notice swelling anywhere on your body, have shortness of breath, black or red bowel movements, brown or red vomitus, desire to drink large amounts of fluids, a fever, or extreme weakness.   Oral Prednisone over Three Weeks  Day  Week 1  Week 2  Week 3   1  12  tablets  9 tablets  5 tablets   2  12 tablets  8 tablets  5 tablets   3  11 tablets  8 tablets  4 tablets   4  11 tablets  7 tablets  4 tablets   5  10 tablets  7 tablets  3 tablets   6  10 tablets  6 tablets  2 tablets   7  9 tablets  6 tablets  1 tablet    Due to recent changes in healthcare laws, you may see results of your pathology and/or laboratory studies on MyChart before the doctors have had a chance to review them. We understand that in some cases there may be results that are confusing or concerning to you. Please understand that not all results are received at the same time and often the doctors may need to interpret multiple results in order to provide you with the best plan of care or course of treatment. Therefore, we ask that you please give [place] 2 business days to thoroughly review all your results before contacting the office for clarification. Should we see a critical lab result, you will be contacted sooner.   If You Need Anything After Your Visit  If you have any questions or concerns for your doctor, please call our main line at [phone] and press  option 4 to reach your doctor's medical assistant. If no one answers, please leave a voicemail as directed and we will return your call as soon as possible. Messages left after 4 pm will be answered the following business day.   You may also send [place] a message via MyChart. We typically respond to MyChart messages within 1-2 business days.  For prescription refills, please ask your pharmacy to contact our office. Our fax number is [phone].  If you have an urgent issue when the clinic is closed that cannot wait until the next business day, you can page your doctor at the number below.    Please note that while we do our best to be available for urgent issues outside of office hours, we are not available 24/7.   If you have an urgent issue and are unable to reach [place], you may choose to seek medical care at your doctor's office, retail clinic, urgent care center, or emergency room.  If you have a medical emergency, please immediately call 911 or go to the emergency department.  Pager Numbers  - Dr. Gwen Pounds: [phone]  - Dr. Roseanne Reno: [phone]  - Dr. Katrinka Blazing: [phone]   In the event of inclement weather, please call our main line at [phone] for  an update on the status of any delays or closures.  Dermatology Medication Tips: Please keep the boxes that topical medications come in in order to help keep track of the instructions about where and how to use these. Pharmacies typically print the medication instructions only on the boxes and not directly on the medication tubes.   If your medication is too expensive, please contact our office at [phone] option 4 or send [place] a message through MyChart.   We are unable to tell what your co-pay for medications will be in advance as this is different depending on your insurance coverage. However, we may be able to find a substitute medication at lower cost or fill out paperwork to get insurance to cover a needed medication.   If a  prior authorization is required to get your medication covered by your insurance company, please allow [place] 1-2 business days to complete this process.  Drug prices often vary depending on where the prescription is filled and some pharmacies may offer cheaper prices.  The website www.goodrx.com contains coupons for medications through different pharmacies. The prices here do not account for what the cost may be with help from insurance (it may be cheaper with your insurance), but the website can give you the price if you did not use any insurance.  - You can print the associated coupon and take it with your prescription to the pharmacy.  - You may also stop by our office during regular business hours and pick up a GoodRx coupon card.  - If you need your prescription sent electronically to a different pharmacy, notify our office through [facility] or by phone at [phone] option 4.     Si Usted Necesita Algo Despus de Su Visita  Tambin puede enviarnos un mensaje a travs de Clinical cytogeneticist. Por lo general respondemos a los mensajes de MyChart en el transcurso de 1 a 2 das hbiles.  Para renovar recetas, por favor pida a su farmacia que se ponga en contacto con nuestra oficina. Annie Sable de fax es [place] [phone].  Si tiene un asunto urgente cuando la clnica est cerrada y que no puede esperar hasta el siguiente da hbil, puede llamar/localizar a su doctor(a) al nmero que aparece a continuacin.   Por favor, tenga en cuenta que aunque hacemos todo lo posible para estar disponibles para asuntos urgentes fuera del horario de [place], no estamos disponibles las 24 horas del da, los 7 [address]  [address] de la [place].   Si tiene un problema urgente y no puede comunicarse con nosotros, puede optar por buscar atencin mdica  en el consultorio de su doctor(a), en una clnica privada, en un centro de atencin urgente o en una sala de emergencias.  Si tiene Engineer, drilling, por favor llame  inmediatamente al 911 o vaya a la sala de emergencias.  Nmeros de bper  - Dr. Gwen Pounds: [phone]  - Dra. Roseanne Reno: [phone]  - Dr. Katrinka Blazing: [phone]   En caso de inclemencias del tiempo, por favor llame a Lacy Duverney principal al [phone] para una actualizacin sobre el [place] de cualquier retraso o cierre.  Consejos para la medicacin en dermatologa: Por favor, guarde las cajas en las que vienen los medicamentos de uso tpico para ayudarle a seguir las instrucciones sobre dnde y cmo usarlos. Las farmacias generalmente imprimen las instrucciones del medicamento slo en las cajas y no directamente en los tubos del [place].   Si su medicamento es [employer], por favor, pngase en contacto con Ferne Coe  oficina llamando al [phone] y presione la opcin 4 o envenos un mensaje a travs de Clinical cytogeneticist.   No podemos decirle cul ser su copago por los medicamentos por adelantado ya que esto es diferente dependiendo de la cobertura de su seguro. Sin embargo, es posible que podamos encontrar un medicamento sustituto a Audiological scientist un formulario para que el seguro cubra el medicamento que se considera necesario.   Si se requiere una autorizacin previa para que su compaa de seguros [place] su medicamento, por favor permtanos de 1 a 2 das hbiles para completar [address]  Los precios de los medicamentos varan con frecuencia dependiendo del Environmental consultant de dnde se surte la receta y alguna farmacias pueden ofrecer precios ms baratos.  El sitio web www.goodrx.com tiene cupones para medicamentos de Health and safety inspector. Los precios aqu no tienen en cuenta lo que podra costar con la ayuda del seguro (puede ser ms barato con su seguro), pero el sitio web puede darle el precio si no utiliz Tourist information centre manager.  - Puede imprimir el cupn correspondiente y llevarlo con su receta a la farmacia.  - Tambin puede pasar por nuestra oficina durante el horario de atencin regular y  Education officer, museum una tarjeta de cupones de GoodRx.  - Si necesita que su receta se enve electrnicamente a una farmacia diferente, informe a nuestra oficina a travs de MyChart de [place] o por telfono llamando al [phone] y presione la opcin 4.

## 2023-08-24 ENCOUNTER — Encounter: Payer: Self-pay | Admitting: Dermatology

## 2023-08-29 ENCOUNTER — Other Ambulatory Visit: Payer: Self-pay | Admitting: Physician Assistant

## 2023-08-29 DIAGNOSIS — I10 Essential (primary) hypertension: Secondary | ICD-10-CM

## 2023-10-01 DIAGNOSIS — Z0000 Encounter for general adult medical examination without abnormal findings: Secondary | ICD-10-CM | POA: Insufficient documentation

## 2023-10-01 NOTE — Progress Notes (Signed)
Established patient visit   Patient: Teresa Torres   DOB: 1951/07/18   72 y.o. Female  MRN: [id number] Visit Date: 10/02/2023  Today's healthcare provider: Ronnald Ramp, MD   Chief Complaint  Patient presents with   Medical Management of Chronic Issues    Checking BP only, no new concerns    Subjective     HPI     Medical Management of Chronic Issues    Additional comments: Checking BP only, no new concerns       Last edited by Rolly Salter, CMA on 10/02/2023  8:46 AM.       Discussed the use of AI scribe software for clinical note transcription with the patient, who gave verbal consent to proceed.  History of Present Illness   The patient, known as Teresa Torres, presented for a follow-up visit primarily concerning blood pressure management. She has been on Losartan 50mg , which she takes at night, and report that her blood pressure readings at home have been satisfactory, generally in the 130s. She deny any associated symptoms such as headaches, double vision, or dizziness.  In addition to hypertension, the patient has a history of contact dermatitis. She recently experienced a widespread rash, initially thought to be poison ivy or sumac, which required a 21-day course of Prednisone. The rash has improved but some spots persist. The patient reported significant side effects from the Prednisone, including insomnia and mental restlessness.  The patient also has a history of gastroesophageal reflux disease (GERD), managed with Dexilant 60mg . Despite medication, she continue to experience significant reflux symptoms, particularly if she eat late in the evening. She report waking up in the middle of the night with a sensation of drowning and often sleep sitting up to manage her symptoms.  Furthermore, the patient has a bladder prolapse and has been using a low-dose estrogen cream as prescribed by a urogynecologist. She also report chronic bilateral pedal edema, which has  been present for approximately 40 years. Despite the edema, there is no pitting edema noted in the lower extremities.  Lastly, the patient is on Atorvastatin 20mg  for cholesterol management, which was reported to be well-controlled at her last visit. She continue to work full-time in an [employer] and maintain an active lifestyle.         Past Medical History:  Diagnosis Date   Allergy 1980   See chart   Arthritis    of spine   Basal cell carcinoma 04/23/2022   Right low back above waistline, EDC   GERD (gastroesophageal reflux disease) 1986   See chart   Hiatal hernia    Hyperlipidemia    Hypertension 2022   See chart    Medications: Outpatient Medications Prior to Visit  Medication Sig   atorvastatin (LIPITOR) 20 MG tablet Take 1 tablet (20 mg total) by mouth daily.   dexlansoprazole (DEXILANT) 60 MG capsule Take 1 capsule (60 mg total) by mouth daily. MUST SCHEDULE OFFICE VISIT   estradiol (ESTRACE) 0.1 MG/GM vaginal cream Place 0.5 g vaginally 2 (two) times a week. Place 0.5g nightly for two weeks then twice a week after   hydrocortisone 2.5 % cream Apply to aa's face and ears QD on Monday, Wednesday, and Friday   ketoconazole (NIZORAL) 2 % cream Apply to aa's ears and face QD on Tuesday, Thursday, and Saturday.   Lactobacillus (AZO COMPLETE FEMININE BALANCE PO) Take 1 tablet by mouth daily.   Misc Natural Products (NEURIVA PO) Take 1 tablet  by mouth daily.   niacin 500 MG tablet Take 500 mg by mouth at bedtime.   [DISCONTINUED] Azelastine-Fluticasone 137-50 MCG/ACT SUSP Place 1 spray into the nose every 12 (twelve) hours.   [DISCONTINUED] losartan (COZAAR) 25 MG tablet TAKE 1 TABLET BY MOUTH EVERY DAY   [DISCONTINUED] predniSONE (DELTASONE) 5 MG tablet Stop previous prednisone taper and use new prednisone taper as prescribed on handout. (Patient not taking: Reported on 10/02/2023)   No facility-administered medications prior to visit.    Review of  Systems  Last metabolic panel Lab Results  Component Value Date   GLUCOSE 90 04/03/2023   NA 146 (H) 04/03/2023   K 4.1 04/03/2023   CL 106 04/03/2023   CO2 24 04/03/2023   BUN 14 04/03/2023   CREATININE 0.88 04/03/2023   EGFR 71 04/03/2023   CALCIUM 9.8 04/03/2023   PROT 6.4 04/03/2023   ALBUMIN 4.3 04/03/2023   LABGLOB 2.1 04/03/2023   AGRATIO 2.0 04/03/2023   BILITOT 0.4 04/03/2023   ALKPHOS 73 04/03/2023   AST 22 04/03/2023   ALT 18 04/03/2023   ANIONGAP 7 09/02/2013        Objective    BP 137/64 (BP Location: Left Arm, Patient Position: Sitting, Cuff Size: Large)   Pulse 76   Ht 5' 1.5" (1.562 m)   Wt 168 lb 1.6 oz (76.2 kg)   SpO2 100%   BMI 31.25 kg/m  BP Readings from Last 3 Encounters:  10/02/23 137/64  08/14/23 122/77  04/03/23 135/74   Wt Readings from Last 3 Encounters:  10/02/23 168 lb 1.6 oz (76.2 kg)  04/03/23 162 lb 11.2 oz (73.8 kg)  03/03/23 161 lb (73 kg)       Physical Exam  Physical Exam   CHEST: Lungs clear to auscultation bilaterally. CARDIOVASCULAR: Heart rhythm regular, no abnormal sounds detected. EXTREMITIES: No pitting edema in legs, pedal edema present bilaterally, chronic.       No results found for any visits on 10/02/23.  Assessment & Plan     Problem List Items Addressed This Visit     Annual physical exam   Primary hypertension - Primary   Relevant Medications   losartan (COZAAR) 50 MG tablet   Other Relevant Orders   Basic Metabolic Panel (BMET)   Other Visit Diagnoses     Vaginal atrophy       Encounter for immunization       Relevant Orders   Flu Vaccine Trivalent High Dose (Fluad) (Completed)           Hypertension Well controlled on Losartan 50mg  daily with home readings in the 130s. No symptoms of hypotension reported. -Continue Losartan 50mg  daily. -Order Basic Metabolic Panel (BMP) to assess potassium and creatinine levels due to increased dose of Losartan.   Gastroesophageal Reflux  Disease (GERD) Chronic condition, not well controlled despite Dexilant 60mg . Patient reports nocturnal symptoms and has made lifestyle modifications including dietary changes and sleeping with bed elevated. -Continue Dexilant 60mg  daily. -Continue to follow up with  gastroenterologist as previously scheduled   Bladder Prolapse Managed conservatively with low-dose Estradiol cream. No current symptoms of urinary tract infection (UTI) or incontinence. -Continue Estradiol cream as needed. -Refill prescription when required.  Hyperlipidemia Well controlled on Atorvastatin 20mg  daily. -Continue Atorvastatin 20mg  daily.  Chronic Pedal Edema Longstanding bilateral pedal edema, no signs of pitting or lower extremity edema. -No changes to current management.  General Health Maintenance -Administer influenza vaccine today. -Plan for colonoscopy in December 2024 as per  previous schedule.         Return in about 6 months (around 04/01/2024) for HTN, Cholesterol.         Ronnald Ramp, MD  [facility] [phone] (phone) [phone] (fax)  [facility]

## 2023-10-02 ENCOUNTER — Encounter: Payer: No Typology Code available for payment source | Admitting: Physician Assistant

## 2023-10-02 ENCOUNTER — Ambulatory Visit (INDEPENDENT_AMBULATORY_CARE_PROVIDER_SITE_OTHER): Payer: No Typology Code available for payment source | Admitting: Family Medicine

## 2023-10-02 ENCOUNTER — Encounter: Payer: Self-pay | Admitting: Family Medicine

## 2023-10-02 VITALS — BP 137/64 | HR 76 | Ht 61.5 in | Wt 168.1 lb

## 2023-10-02 DIAGNOSIS — I10 Essential (primary) hypertension: Secondary | ICD-10-CM

## 2023-10-02 DIAGNOSIS — Z23 Encounter for immunization: Secondary | ICD-10-CM | POA: Diagnosis not present

## 2023-10-02 DIAGNOSIS — N952 Postmenopausal atrophic vaginitis: Secondary | ICD-10-CM | POA: Diagnosis not present

## 2023-10-02 DIAGNOSIS — Z0000 Encounter for general adult medical examination without abnormal findings: Secondary | ICD-10-CM | POA: Diagnosis not present

## 2023-10-02 MED ORDER — LOSARTAN POTASSIUM 50 MG PO TABS: 50.0000 mg | ORAL_TABLET | Freq: Every day | ORAL | Status: DC

## 2023-10-02 NOTE — Assessment & Plan Note (Deleted)
Chronic conditions are stable  Patient was counseled on benefits of regular physical activity with goal of 150 minutes of moderate to vigurous intensity 4 days per week  Patient was counseled to consume well balanced diet of fruits, vegetables, limited saturated fats and limited sugary foods and beverages with emphasis on consuming 6-8 glasses of water daily  Screening recommended today: A1c  Vaccines recommended today: COVID,Shingrix

## 2023-10-03 LAB — BASIC METABOLIC PANEL
BUN/Creatinine Ratio: 18 (ref 12–28)
BUN: 15 mg/dL (ref 8–27)
CO2: 27 mmol/L (ref 20–29)
Calcium: 9.4 mg/dL (ref 8.7–10.3)
Chloride: 105 mmol/L (ref 96–106)
Creatinine, Ser: 0.85 mg/dL (ref 0.57–1.00)
Glucose: 86 mg/dL (ref 70–99)
Potassium: 4.4 mmol/L (ref 3.5–5.2)
Sodium: 143 mmol/L (ref 134–144)
eGFR: 74 mL/min/{1.73_m2} (ref >59)

## 2023-10-06 ENCOUNTER — Other Ambulatory Visit: Payer: Self-pay | Admitting: Gastroenterology

## 2023-10-07 ENCOUNTER — Encounter: Payer: Self-pay | Admitting: Family Medicine

## 2023-10-08 ENCOUNTER — Other Ambulatory Visit: Payer: Self-pay | Admitting: Family Medicine

## 2023-10-08 MED ORDER — LOSARTAN POTASSIUM 50 MG PO TABS: 25.0000 mg | ORAL_TABLET | Freq: Every day | ORAL | Status: DC

## 2023-10-13 ENCOUNTER — Telehealth: Payer: Self-pay | Admitting: Gastroenterology

## 2023-10-13 NOTE — Telephone Encounter (Signed)
PT requesting call back to discuss pre authorization for one of her medications pt did not leave name of medication

## 2023-10-14 ENCOUNTER — Other Ambulatory Visit: Payer: Self-pay | Admitting: Gastroenterology

## 2023-10-14 MED ORDER — DEXLANSOPRAZOLE 60 MG PO CPDR: 60.0000 mg | DELAYED_RELEASE_CAPSULE | Freq: Every day | ORAL | 0 refills | Status: DC

## 2023-10-14 NOTE — Telephone Encounter (Signed)
PA has been submitted via [email or web address] with attached progress notes, awaiting response

## 2023-10-14 NOTE — Telephone Encounter (Signed)
The patient called in and left a voicemail about her medication. I called her back she stated that she called because they didn't have valid prescriptions. The prescriptions that was sent has expired and they will need a new one. The patient stated that she is going to call [employer].

## 2023-10-14 NOTE — Telephone Encounter (Signed)
This request is approved from 10/14/2023 to 10/13/2024.  Pt is aware as instructed

## 2023-10-14 NOTE — Telephone Encounter (Signed)
Patient called in requesting to get a Prior Authorization done for her medication (Dexilant) 60 MG. Transfer her to the nurse and gave her the direct number.

## 2023-10-14 NOTE — Addendum Note (Signed)
Addended by: Roena Malady on: 10/14/2023 11:26 AM   Modules accepted: Orders

## 2023-10-14 NOTE — Telephone Encounter (Signed)
Rx sent through e-scribe again

## 2023-10-14 NOTE — Telephone Encounter (Signed)
Melinda from [employer] calling in with the patient trying to get the prior authorization.

## 2023-10-16 NOTE — Telephone Encounter (Signed)
I called the pharmacy and had them run the Rx, they stated that it went through for $20 co-pay.  I called the pt and lmovm letting her know

## 2023-10-17 ENCOUNTER — Other Ambulatory Visit: Payer: Self-pay | Admitting: Physician Assistant

## 2023-10-17 DIAGNOSIS — E782 Mixed hyperlipidemia: Secondary | ICD-10-CM

## 2023-10-19 NOTE — Telephone Encounter (Signed)
Requested Prescriptions  Pending Prescriptions Disp Refills   atorvastatin (LIPITOR) 20 MG tablet [Pharmacy Med Name: ATORVASTATIN 20 MG TABLET] 90 tablet 2    Sig: TAKE 1 TABLET BY MOUTH EVERY DAY     Cardiovascular:  Antilipid - Statins Failed - 10/17/2023  9:27 AM      Failed - Lipid Panel in normal range within the last 12 months    Cholesterol, Total  Date Value Ref Range Status  04/03/2023 183 100 - 199 mg/dL Final   LDL Chol Calc (NIH)  Date Value Ref Range Status  04/03/2023 94 0 - 99 mg/dL Final   HDL  Date Value Ref Range Status  04/03/2023 75 >39 mg/dL Final   Triglycerides  Date Value Ref Range Status  04/03/2023 79 0 - 149 mg/dL Final         Passed - Patient is not pregnant      Passed - Valid encounter within last 12 months    Recent Outpatient Visits           2 weeks ago Primary hypertension   [facility] Simmons-Robinson, [place], MD   6 months ago Annual physical exam   [facility] Whigham, [place], PA-C   9 months ago Acute non-recurrent frontal sinusitis   [facility] Alfredia Ferguson, PA-C   1 year ago Primary hypertension   [facility] Alfredia Ferguson, PA-C   1 year ago Dysuria   [facility] Alfredia Ferguson, PA-C       Future Appointments             In 1 month [name], MD [facility] [place]   In 5 months Simmons-Robinson, Tawanna Cooler, MD [facility]   In 10 months Deirdre Evener, MD [facility]

## 2023-11-19 ENCOUNTER — Encounter: Payer: Self-pay | Admitting: Gastroenterology

## 2023-11-19 ENCOUNTER — Ambulatory Visit: Payer: No Typology Code available for payment source | Admitting: Gastroenterology

## 2023-11-19 VITALS — BP 153/79 | HR 78 | Temp 98.1°F | Wt 168.5 lb

## 2023-11-19 DIAGNOSIS — K219 Gastro-esophageal reflux disease without esophagitis: Secondary | ICD-10-CM | POA: Diagnosis not present

## 2023-11-19 MED ORDER — DEXLANSOPRAZOLE 60 MG PO CPDR: 60.0000 mg | DELAYED_RELEASE_CAPSULE | Freq: Every day | ORAL | 3 refills | Status: DC

## 2023-11-19 NOTE — Progress Notes (Signed)
Primary Care Physician: Ronnald Ramp, MD  Primary Gastroenterologist:  Dr. Midge Torres  Chief Complaint  Patient presents with   Follow-up    HPI: Teresa Torres is a 72 y.o. female here for follow-up for medication refill.  The patient states she is to be doing well on the medication although she states that she switched pharmacies and feels that the new pharmacy prescription efficacy is not the same.  She states it is still working for her but not as well as it had in the past.  The patient denies any unexplained weight loss fevers chills nausea vomiting black stools or bloody stools.  Past Medical History:  Diagnosis Date   Allergy 1980   See chart   Arthritis    of spine   Basal cell carcinoma 04/23/2022   Right low back above waistline, EDC   GERD (gastroesophageal reflux disease) 1986   See chart   Hiatal hernia    Hyperlipidemia    Hypertension 2022   See chart    Current Outpatient Medications  Medication Sig Dispense Refill   atorvastatin (LIPITOR) 20 MG tablet TAKE 1 TABLET BY MOUTH EVERY DAY 90 tablet 2   dexlansoprazole (DEXILANT) 60 MG capsule Take 1 capsule (60 mg total) by mouth daily. 90 capsule 0   estradiol (ESTRACE) 0.1 MG/GM vaginal cream Place 0.5 g vaginally 2 (two) times a week. Place 0.5g nightly for two weeks then twice a week after 30 g 11   hydrocortisone 2.5 % cream Apply to aa's face and ears QD on Monday, Wednesday, and Friday 28 g 5   ketoconazole (NIZORAL) 2 % cream Apply to aa's ears and face QD on Tuesday, Thursday, and Saturday. 60 g 5   Lactobacillus (AZO COMPLETE FEMININE BALANCE PO) Take 1 tablet by mouth daily.     losartan (COZAAR) 50 MG tablet Take 0.5 tablets (25 mg total) by mouth daily.     Misc Natural Products (NEURIVA PO) Take 1 tablet by mouth daily.     niacin 500 MG tablet Take 500 mg by mouth at bedtime.     No current facility-administered medications for this visit.    Allergies as of 11/19/2023 - Review  Complete 11/19/2023  Allergen Reaction Noted   Compazine [prochlorperazine edisylate] Anaphylaxis 08/13/2015   Lisinopril Cough 03/03/2023   Aspirin Nausea And Vomiting 08/13/2015   Nitrofurantoin Nausea And Vomiting 12/15/2016    ROS:  General: Negative for anorexia, weight loss, fever, chills, fatigue, weakness. ENT: Negative for hoarseness, difficulty swallowing , nasal congestion. CV: Negative for chest pain, angina, palpitations, dyspnea on exertion, peripheral edema.  Respiratory: Negative for dyspnea at rest, dyspnea on exertion, cough, sputum, wheezing.  GI: See history of present illness. GU:  Negative for dysuria, hematuria, urinary incontinence, urinary frequency, nocturnal urination.  Endo: Negative for unusual weight change.    Physical Examination:   BP (!) 153/79 (BP Location: Right Arm, Patient Position: Sitting, Cuff Size: Normal)   Pulse 78   Temp 98.1 F (36.7 C) (Oral)   Wt 168 lb 8 oz (76.4 kg)   BMI 31.32 kg/m   General: Well-nourished, well-developed in no acute distress.  Eyes: No icterus. Conjunctivae pink. Neuro: Alert and oriented x 3.  Grossly intact. Skin: Warm and dry, no jaundice.   Psych: Alert and cooperative, normal mood and affect.  Labs:    Imaging Studies: No results found.  Assessment and Plan:   Teresa Torres is a 72 y.o. y/o female who comes  in today with a history of reflux doing well on Dexilant.  The patient has no worry symptoms.  The patient will be given a refill for the entire year.  The patient states that she is due for colonoscopy for next year.  She will contact us next year to set that colonoscopy.  The patient has been explained the plan and agrees with it.     Teresa Minium, MD. Clementeen Graham    Note: This dictation was prepared with Dragon dictation along with smaller phrase technology. Any transcriptional errors that result from this process are unintentional.

## 2024-01-10 ENCOUNTER — Ambulatory Visit
Admission: EM | Admit: 2024-01-10 | Discharge: 2024-01-10 | Disposition: A | Discharge status: Home / Self Care | Payer: No Typology Code available for payment source | Attending: Emergency Medicine | Admitting: Emergency Medicine

## 2024-01-10 DIAGNOSIS — J0100 Acute maxillary sinusitis, unspecified: Secondary | ICD-10-CM | POA: Diagnosis not present

## 2024-01-10 DIAGNOSIS — H1033 Unspecified acute conjunctivitis, bilateral: Secondary | ICD-10-CM | POA: Diagnosis not present

## 2024-01-10 MED ORDER — POLYMYXIN B-TRIMETHOPRIM 10000-0.1 UNIT/ML-% OP SOLN: 1.0000 [drp] | Freq: Four times a day (QID) | OPHTHALMIC | 0 refills | Status: AC

## 2024-01-10 MED ORDER — AMOXICILLIN-POT CLAVULANATE 875-125 MG PO TABS: 1.0000 | ORAL_TABLET | Freq: Two times a day (BID) | ORAL | 0 refills | Status: DC

## 2024-01-10 NOTE — Discharge Instructions (Addendum)
 Use the eye drops as directed.  Take the Augmentin as directed.  Follow-up with your primary care provider if your symptoms are not improving.

## 2024-01-10 NOTE — ED Provider Notes (Signed)
 [facility]    CSN: [id number] Arrival date & time: 01/10/24  1106      History   Chief Complaint Chief Complaint  Patient presents with   Nasal Congestion    HPI Teresa Torres is a 73 y.o. female.  Patient presents with 1 week history of sinus pressure, congestion, postnasal drip, runny nose. She reports very little cough.  Treatment attempted with nasal rinse.  No fever or shortness of breath.  She developed right eye redness, itching, drainage yesterday.  She woke up this morning with her eyelashes matted and is starting to have redness and itching in her left eye now.  No eye trauma, eye pain, change in vision.  Treatment attempted with cool compress.  The history is provided by the patient and medical records.    Past Medical History:  Diagnosis Date   Allergy 1980   See chart   Arthritis    of spine   Basal cell carcinoma 04/23/2022   Right low back above waistline, EDC   GERD (gastroesophageal reflux disease) 1986   See chart   Hiatal hernia    Hyperlipidemia    Hypertension 2022   See chart    Patient Active Problem List   Diagnosis Date Noted   Annual physical exam 10/01/2023   Vasovagal episode 09/05/2022   Nasal congestion 11/29/2021   Atypical mole 10/18/2021   Pruritus 10/18/2021   Arthritis of spine 01/21/2016   Primary hypertension 07/30/2015   Arteriosclerosis of coronary artery 11/17/2014   Acid reflux 11/17/2014   Palpitations 11/17/2014   HYPERLIPIDEMIA, MIXED 09/03/2009    Past Surgical History:  Procedure Laterality Date   ABDOMINAL HYSTERECTOMY  1984   Partial   BREAST BIOPSY Right 1972   EXCISIONAL - NEG   CHOLECYSTECTOMY  2007-2008   KNEE SURGERY  1980   TONSILLECTOMY  1961    OB History     Gravida  1   Para  1   Term  1   Preterm      AB      Living  1      SAB      IAB      Ectopic      Multiple      Live Births  1            Home Medications    Prior to Admission medications    Medication Sig Start Date End Date Taking? Authorizing Provider  amoxicillin -clavulanate (AUGMENTIN ) 875-125 MG tablet Take 1 tablet by mouth every 12 (twelve) hours. 01/10/24  Yes Corlis Burnard DEL, NP  trimethoprim -polymyxin b  (POLYTRIM ) ophthalmic solution Place 1 drop into both eyes 4 (four) times daily for 7 days. 01/10/24 01/17/24 Yes Corlis Burnard DEL, NP  atorvastatin  (LIPITOR) 20 MG tablet TAKE 1 TABLET BY MOUTH EVERY DAY 10/19/23   Simmons-Robinson, [place], MD  dexlansoprazole  (DEXILANT ) 60 MG capsule Take 1 capsule (60 mg total) by mouth daily. 11/19/23   Jinny Carmine, MD  estradiol  (ESTRACE ) 0.1 MG/GM vaginal cream Place 0.5 g vaginally 2 (two) times a week. Place 0.5g nightly for two weeks then twice a week after 03/05/23   Marilynne Rosaline SAILOR, MD  hydrocortisone  2.5 % cream Apply to aa's face and ears QD on Monday, Wednesday, and Friday 08/17/23   Hester Alm BROCKS, MD  ketoconazole  (NIZORAL ) 2 % cream Apply to aa's ears and face QD on Tuesday, Thursday, and Saturday. 08/17/23   Hester Alm BROCKS, MD  Lactobacillus (AZO COMPLETE FEMININE  BALANCE PO) Take 1 tablet by mouth daily.    [provider]  losartan  (COZAAR ) 50 MG tablet Take 0.5 tablets (25 mg total) by mouth daily. 10/08/23   Simmons-Robinson, Rockie, MD  Misc Natural Products (NEURIVA PO) Take 1 tablet by mouth daily.    [provider]  niacin 500 MG tablet Take 500 mg by mouth at bedtime.    [provider]    Family History Family History  Problem Relation Age of Onset   Hypertension Mother    Heart disease Mother    Arthritis Mother    Miscarriages / Stillbirths Mother    Breast cancer Paternal Grandmother    Diabetes Maternal Grandmother     Social History Social History   Tobacco Use   Smoking status: Former   Smokeless tobacco: Never  Vaping Use   Vaping status: Never Used  Substance Use Topics   Alcohol use: Yes    Alcohol/week: 6.0 standard drinks of alcohol    Types: 6 Glasses  of wine per week   Drug use: Never     Allergies   Compazine [prochlorperazine edisylate], Lisinopril , Aspirin, and Nitrofurantoin    Review of Systems Review of Systems  Constitutional:  Negative for chills and fever.  HENT:  Positive for congestion, postnasal drip, rhinorrhea and sinus pressure. Negative for ear pain and sore throat.   Eyes:  Positive for discharge, redness and itching. Negative for pain and visual disturbance.  Respiratory:  Negative for cough and shortness of breath.      Physical Exam Triage Vital Signs ED Triage Vitals  Encounter Vitals Group     BP 01/10/24 1126 134/82     Systolic BP Percentile --      Diastolic BP Percentile --      Pulse Rate 01/10/24 1116 83     Resp 01/10/24 1116 18     Temp 01/10/24 1116 97.8 F (36.6 C)     Temp src --      SpO2 01/10/24 1116 97 %     Weight --      Height --      Head Circumference --      Peak Flow --      Pain Score 01/10/24 1112 6     Pain Loc --      Pain Education --      Exclude from Growth Chart --    No data found.  Updated Vital Signs BP 134/82   Pulse 83   Temp 97.8 F (36.6 C)   Resp 18   SpO2 97%   Visual Acuity Right Eye Distance: 20/40 Left Eye Distance: 20/30 Bilateral Distance: 20/25  Right Eye Near:   Left Eye Near:    Bilateral Near:     Physical Exam Constitutional:      General: She is not in acute distress. HENT:     Right Ear: Tympanic membrane normal.     Left Ear: Tympanic membrane normal.     Nose: Congestion and rhinorrhea present.     Mouth/Throat:     Mouth: Mucous membranes are moist.     Pharynx: Oropharynx is clear.  Eyes:     General: Lids are normal. Vision grossly intact.     Conjunctiva/sclera:     Right eye: Right conjunctiva is injected.     Left eye: Left conjunctiva is injected.     Pupils: Pupils are equal, round, and reactive to light.     Comments: R>L  Cardiovascular:  Rate and Rhythm: Normal rate and regular rhythm.     Heart  sounds: Normal heart sounds.  Pulmonary:     Effort: Pulmonary effort is normal. No respiratory distress.     Breath sounds: Normal breath sounds.  Neurological:     Mental Status: She is alert.      UC Treatments / Results  Labs (all labs ordered are listed, but only abnormal results are displayed) Labs Reviewed - No data to display  EKG   Radiology No results found.  Procedures Procedures (including critical care time)  Medications Ordered in UC Medications - No data to display  Initial Impression / Assessment and Plan / UC Course  I have reviewed the triage vital signs and the nursing notes.  Pertinent labs & imaging results that were available during my care of the patient were reviewed by me and considered in my medical decision making (see chart for details).   Bilateral bacterial conjunctivitis, acute sinusitis.  Lungs are clear and O2 sat 97%.  Treating sinusitis with Augmentin  and conjunctivitis with Polytrim  eyedrops.  Instructed patient to follow-up with her PCP if she is not improving.  Education provided on sinus infection and bacterial conjunctivitis.  She agrees to plan of care.  Final Clinical Impressions(s) / UC Diagnoses   Final diagnoses:  Acute bacterial conjunctivitis of both eyes  Acute non-recurrent maxillary sinusitis     Discharge Instructions      Use the eye drops as directed.  Take the Augmentin  as directed.  Follow-up with your primary care provider if your symptoms are not improving.      ED Prescriptions     Medication Sig Dispense Auth. Provider   trimethoprim -polymyxin b  (POLYTRIM ) ophthalmic solution Place 1 drop into both eyes 4 (four) times daily for 7 days. 10 mL Corlis Burnard DEL, NP   amoxicillin -clavulanate (AUGMENTIN ) 875-125 MG tablet Take 1 tablet by mouth every 12 (twelve) hours. 14 tablet Corlis Burnard DEL, NP      PDMP not reviewed this encounter.   Corlis Burnard DEL, NP 01/10/24 1143

## 2024-01-10 NOTE — ED Triage Notes (Signed)
 Patient to Urgent Care with complaints of nasal congestion/ sinus pain.   Symptoms started approx one week ago. Yesterday developed some drainage and itching in her right eye.   Using nasal lavage/ cold compresses.

## 2024-02-27 ENCOUNTER — Other Ambulatory Visit: Payer: Self-pay | Admitting: Family Medicine

## 2024-02-27 DIAGNOSIS — I10 Essential (primary) hypertension: Secondary | ICD-10-CM

## 2024-02-29 NOTE — Telephone Encounter (Signed)
 Requested medications are due for refill today.  Unsure  Requested medications are on the active medications list.  no  Last refill. unsure  Future visit scheduled.   yes  Notes to clinic.  Please review - unsure if pt is supposed to be taking 25 or 50mg .    Requested Prescriptions  Pending Prescriptions Disp Refills   losartan (COZAAR) 25 MG tablet [Pharmacy Med Name: LOSARTAN POTASSIUM 25 MG TAB] 90 tablet 1    Sig: TAKE 1 TABLET BY MOUTH EVERY DAY     Cardiovascular:  Angiotensin Receptor Blockers Passed - 02/29/2024  2:45 PM      Passed - Cr in normal range and within 180 days    Creatinine  Date Value Ref Range Status  09/02/2013 0.86 0.60 - 1.30 mg/dL Final   Creatinine, Ser  Date Value Ref Range Status  10/02/2023 0.85 0.57 - 1.00 mg/dL Final         Passed - K in normal range and within 180 days    Potassium  Date Value Ref Range Status  10/02/2023 4.4 3.5 - 5.2 mmol/L Final  09/02/2013 3.5 3.5 - 5.1 mmol/L Final         Passed - Patient is not pregnant      Passed - Last BP in normal range    BP Readings from Last 1 Encounters:  01/10/24 134/82         Passed - Valid encounter within last 6 months    Recent Outpatient Visits           5 months ago Primary hypertension   [facility] Ronnald Ramp, MD   11 months ago Annual physical exam   [facility] Alfredia Ferguson, PA-C   1 year ago Acute non-recurrent frontal sinusitis   [facility] Alfredia Ferguson, PA-C   1 year ago Primary hypertension   [place] Alfredia Ferguson, PA-C   1 year ago Dysuria   [facility] Alfredia Ferguson, PA-C       Future Appointments             In 1 month Simmons-Robinson, Tawanna Cooler, MD [facility]   In 5 months Deirdre Evener, MD [facility]

## 2024-03-01 ENCOUNTER — Encounter: Payer: Self-pay | Admitting: Family Medicine

## 2024-03-01 NOTE — Telephone Encounter (Signed)
 Please see the message below, she was last seen on 10/22/23

## 2024-03-09 ENCOUNTER — Other Ambulatory Visit: Payer: Self-pay | Admitting: Family Medicine

## 2024-03-09 MED ORDER — LOSARTAN POTASSIUM 25 MG PO TABS: 25.0000 mg | ORAL_TABLET | Freq: Every day | ORAL | 2 refills | Status: AC

## 2024-03-09 MED ORDER — LOSARTAN POTASSIUM 50 MG PO TABS: 25.0000 mg | ORAL_TABLET | Freq: Every day | ORAL | 2 refills | Status: DC

## 2024-03-21 ENCOUNTER — Ambulatory Visit: Admitting: Family Medicine

## 2024-03-21 ENCOUNTER — Encounter: Payer: Self-pay | Admitting: Family Medicine

## 2024-03-21 ENCOUNTER — Ambulatory Visit: Payer: Self-pay

## 2024-03-21 VITALS — BP 142/74 | HR 76 | Resp 16 | Ht 61.0 in | Wt 196.0 lb

## 2024-03-21 DIAGNOSIS — J3489 Other specified disorders of nose and nasal sinuses: Secondary | ICD-10-CM

## 2024-03-21 DIAGNOSIS — J302 Other seasonal allergic rhinitis: Secondary | ICD-10-CM | POA: Diagnosis not present

## 2024-03-21 DIAGNOSIS — R051 Acute cough: Secondary | ICD-10-CM

## 2024-03-21 DIAGNOSIS — J329 Chronic sinusitis, unspecified: Secondary | ICD-10-CM

## 2024-03-21 MED ORDER — AMOXICILLIN-POT CLAVULANATE 875-125 MG PO TABS
1.0000 | ORAL_TABLET | Freq: Two times a day (BID) | ORAL | 0 refills | Status: AC
Start: 2024-03-21 — End: 2024-03-28

## 2024-03-21 MED ORDER — MUPIROCIN 2 % EX OINT
1.0000 | TOPICAL_OINTMENT | Freq: Two times a day (BID) | CUTANEOUS | 0 refills | Status: DC
Start: 2024-03-21 — End: 2024-10-12

## 2024-03-21 MED ORDER — MONTELUKAST SODIUM 10 MG PO TABS
10.0000 mg | ORAL_TABLET | Freq: Every day | ORAL | 0 refills | Status: DC
Start: 2024-03-21 — End: 2024-04-06

## 2024-03-21 NOTE — Telephone Encounter (Signed)
FYI please see the message below.

## 2024-03-21 NOTE — Telephone Encounter (Signed)
  Chief Complaint: sinus pain, eye drainage Symptoms: pain/pressure, cough,  Frequency: 1 week worsening yesterday Pertinent Negatives: Patient denies SOB, fever Disposition: [] ED /[] Urgent Care (no appt availability in office) / [x] Appointment(In office/virtual)/ []  [place] [facility]/ [] Home Care/ [] Refused Recommended Disposition /[] [place] [facility]/ []  Follow-up with PCP Additional Notes: Patient calls reporting sinus pain and pressure, cough, possible pink eye in R eye x1 week worsening yesterday. Per protocol, patient to be evaluated within 4 hours. First available appointment with PCP or any provider in clinic outside of guideline. Patient scheduled with first available provider in alternate clinic for today at 1100. Care advice reviewed, patient verbalized understanding and denies further questions at this time. Alerting PCP for review.    Reason for Disposition  [1] Redness or swelling on the cheek, forehead or around the eye AND [2] no fever  Answer Assessment - Initial Assessment Questions 1. LOCATION: "Where does it hurt?"      Both cheekbones, right eye "gunk", sinus drainage 2. ONSET: "When did the sinus pain start?"  (e.g., hours, days)      Last week with a cough, worsening yesterday morning 3. SEVERITY: "How bad is the pain?"   (Scale 1-10; mild, moderate or severe)   - MILD (1-3): doesn't interfere with normal activities    - MODERATE (4-7): interferes with normal activities (e.g., work or school) or awakens from sleep   - SEVERE (8-10): excruciating pain and patient unable to do any normal activities        3/10 for eye burning, 5/10 for sinus 4. RECURRENT SYMPTOM: "Have you ever had sinus problems before?" If Yes, ask: "When was the last time?" and "What happened that time?"      Yes, first part of January also had pink eye 5. NASAL CONGESTION: "Is the nose blocked?" If Yes, ask: "Can you open it or must you breathe through your mouth?"     Stuffy and  running 6. NASAL DISCHARGE: "Do you have discharge from your nose?" If so ask, "What color?"     Yellow green drainage 7. FEVER: "Do you have a fever?" If Yes, ask: "What is it, how was it measured, and when did it start?"      Denies 8. OTHER SYMPTOMS: "Do you have any other symptoms?" (e.g., sore throat, cough, earache, difficulty breathing)     R eye red and burning, crusted. Cough,  Protocols used: Sinus Pain or Congestion-A-AH

## 2024-03-21 NOTE — Progress Notes (Signed)
 Patient ID: Teresa Torres, female    DOB: 30-Oct-1951, 73 y.o.   MRN: [id number]  PCP: Ronnald Ramp, MD  Chief Complaint  Patient presents with   Sinusitis    X3 days   Cough    Productive since OTC Mucinex    Subjective:   Teresa Torres is a 73 y.o. female, presents to clinic with CC of the following:  HPI  Cough congestion and drainage since last week (wed at least 5 d ago) with ear pressure, sinus pain and tenderness Hx of allergies and sinusitis, she is unable to take antihistamines due to nose bleeds She is managing with flonase and sinus rinses She is coughing if she lays down, trying OTC meds like nyquil Last week cough was worse and felt tight and wheezy, she does not feel like that now, no CP or SOB  Patient Active Problem List   Diagnosis Date Noted   Annual physical exam 10/01/2023   Vasovagal episode 09/05/2022   Nasal congestion 11/29/2021   Atypical mole 10/18/2021   Pruritus 10/18/2021   Arthritis of spine 01/21/2016   Primary hypertension 07/30/2015   Arteriosclerosis of coronary artery 11/17/2014   Acid reflux 11/17/2014   Palpitations 11/17/2014   HYPERLIPIDEMIA, MIXED 09/03/2009      Current Outpatient Medications:    atorvastatin (LIPITOR) 20 MG tablet, TAKE 1 TABLET BY MOUTH EVERY DAY, Disp: 90 tablet, Rfl: 2   dexlansoprazole (DEXILANT) 60 MG capsule, Take 1 capsule (60 mg total) by mouth daily., Disp: 90 capsule, Rfl: 3   estradiol (ESTRACE) 0.1 MG/GM vaginal cream, Place 0.5 g vaginally 2 (two) times a week. Place 0.5g nightly for two weeks then twice a week after, Disp: 30 g, Rfl: 11   hydrocortisone 2.5 % cream, Apply to aa's face and ears QD on Monday, Wednesday, and Friday, Disp: 28 g, Rfl: 5   ketoconazole (NIZORAL) 2 % cream, Apply to aa's ears and face QD on Tuesday, Thursday, and Saturday., Disp: 60 g, Rfl: 5   Lactobacillus (AZO COMPLETE FEMININE BALANCE PO), Take 1 tablet by mouth daily., Disp: , Rfl:    losartan (COZAAR)  25 MG tablet, Take 1 tablet (25 mg total) by mouth daily., Disp: 90 tablet, Rfl: 2   Misc Natural Products (NEURIVA PO), Take 1 tablet by mouth daily., Disp: , Rfl:    niacin 500 MG tablet, Take 500 mg by mouth at bedtime., Disp: , Rfl:    Allergies  Allergen Reactions   Compazine [Prochlorperazine Edisylate] Anaphylaxis   Lisinopril Cough   Aspirin Nausea And Vomiting   Nitrofurantoin Nausea And Vomiting     Social History   Tobacco Use   Smoking status: Former   Smokeless tobacco: Never  Vaping Use   Vaping status: Never Used  Substance Use Topics   Alcohol use: Yes    Alcohol/week: 6.0 standard drinks of alcohol    Types: 6 Glasses of wine per week   Drug use: Never      Chart Review Today: I personally reviewed active problem list, medication list, allergies, family history, social history, health maintenance, notes from last encounter, lab results, imaging with the patient/caregiver today.   Review of Systems  Constitutional: Negative.   HENT: Negative.    Eyes: Negative.   Respiratory: Negative.    Cardiovascular: Negative.   Gastrointestinal: Negative.   Endocrine: Negative.   Genitourinary: Negative.   Musculoskeletal: Negative.   Skin: Negative.   Allergic/Immunologic: Negative.   Neurological: Negative.  Hematological: Negative.   Psychiatric/Behavioral: Negative.    All other systems reviewed and are negative.      Objective:   Vitals:   03/21/24 1117 03/21/24 1122  BP: (!) 142/78 (!) 142/74  Pulse: 76   Resp: 16   SpO2: 99%   Weight: 196 lb (88.9 kg)   Height: 5\' 1"  (1.549 m)     Body mass index is 37.03 kg/m.  Physical Exam Vitals and nursing note reviewed.  Constitutional:      General: She is not in acute distress.    Appearance: Normal appearance. She is well-developed. She is not ill-appearing, toxic-appearing or diaphoretic.  HENT:     Head: Normocephalic and atraumatic.     Right Ear: Hearing and external ear normal. No  drainage, swelling or tenderness. A middle ear effusion is present. Tympanic membrane is injected. Tympanic membrane is not bulging.     Left Ear: Hearing and external ear normal. No drainage, swelling or tenderness. A middle ear effusion is present. Tympanic membrane is injected. Tympanic membrane is not bulging.     Ears:     Comments: Bilateral canal erythema    Nose: Nasal tenderness and congestion present.     Right Sinus: Maxillary sinus tenderness present. No frontal sinus tenderness.     Left Sinus: Maxillary sinus tenderness present. No frontal sinus tenderness.     Comments: Very dry abnormal looking nasal mucosa and septum No active bleeding    Mouth/Throat:     Mouth: Mucous membranes are dry.     Pharynx: Oropharynx is clear. Uvula midline. No pharyngeal swelling.     Tonsils: No tonsillar exudate.  Eyes:     General:        Right eye: No discharge.        Left eye: No discharge.     Conjunctiva/sclera: Conjunctivae normal.  Neck:     Trachea: No tracheal deviation.  Cardiovascular:     Rate and Rhythm: Normal rate and regular rhythm.  Pulmonary:     Effort: Pulmonary effort is normal. No respiratory distress.     Breath sounds: Normal breath sounds. No stridor. No wheezing, rhonchi or rales.  Skin:    General: Skin is warm and dry.     Findings: No rash.  Neurological:     Mental Status: She is alert.     Motor: No abnormal muscle tone.     Coordination: Coordination normal.  Psychiatric:        Behavior: Behavior normal.      Results for orders placed or performed in visit on 10/02/23  Basic Metabolic Panel (BMET)   Collection Time: 10/02/23  9:49 AM  Result Value Ref Range   Glucose 86 70 - 99 mg/dL   BUN 15 8 - 27 mg/dL   Creatinine, Ser 0.98 0.57 - 1.00 mg/dL   eGFR 74 >11 BJ/YNW/2.95   BUN/Creatinine Ratio 18 12 - 28   Sodium 143 134 - 144 mmol/L   Potassium 4.4 3.5 - 5.2 mmol/L   Chloride 105 96 - 106 mmol/L   CO2 27 20 - 29 mmol/L   Calcium 9.4  8.7 - 10.3 mg/dL       Assessment & Plan:   1. Rhinosinusitis (Primary) Onset last week, very ttp on exam to maxillary sinus will cover for ABS with augmentin Encouraged her to continue flonase and sinus rinses, can add sinus saline spray, trial of adding singulair for allergies - amoxicillin-clavulanate (AUGMENTIN) 875-125 MG tablet; Take 1  tablet by mouth 2 (two) times daily for 7 days.  Dispense: 14 tablet; Refill: 0  2. Acute cough Improving, mostly at night gets worse if she lays down due to postnasal drip Lungs CTA A&P on exam No SOB, pleuritic CP, fatigue, fever, sweats  3. Seasonal allergies Unable to tolerate antihistamines due to nose bleeds Discussed trial of singulair and reviewed SE and black box warning - montelukast (SINGULAIR) 10 MG tablet; Take 1 tablet (10 mg total) by mouth at bedtime.  Dispense: 90 tablet; Refill: 0  4. Nasal sore Very dry abnormal appearing nasal mucosa Hydrate with saline spray and may also want to try tx for staph with mupirocin - mupirocin ointment (BACTROBAN) 2 %; Apply 1 Application topically 2 (two) times daily. To nostrils for 5 to 10 days  Dispense: 22 g; Refill: 0  she has f/up with PCP for BP in a few weeks, BP high today but she is on OTC cold/cough meds Encouraged her to discuss singulair with pcp for continuation of med if its helpful  Right eye congestion and drainage this am, encouraged warm compresses - at this point will not add abx eye drops   Danelle Berry, PA-C 03/21/24 11:31 AM

## 2024-04-01 ENCOUNTER — Ambulatory Visit: Payer: Self-pay | Admitting: Family Medicine

## 2024-04-06 ENCOUNTER — Ambulatory Visit: Admitting: Family Medicine

## 2024-04-06 ENCOUNTER — Encounter: Payer: Self-pay | Admitting: Family Medicine

## 2024-04-06 VITALS — BP 135/73 | HR 70 | Ht 61.0 in | Wt 165.8 lb

## 2024-04-06 DIAGNOSIS — R0981 Nasal congestion: Secondary | ICD-10-CM

## 2024-04-06 DIAGNOSIS — E782 Mixed hyperlipidemia: Secondary | ICD-10-CM | POA: Diagnosis not present

## 2024-04-06 DIAGNOSIS — I10 Essential (primary) hypertension: Secondary | ICD-10-CM

## 2024-04-06 DIAGNOSIS — K219 Gastro-esophageal reflux disease without esophagitis: Secondary | ICD-10-CM

## 2024-04-06 DIAGNOSIS — Z23 Encounter for immunization: Secondary | ICD-10-CM

## 2024-04-06 MED ORDER — ATORVASTATIN CALCIUM 20 MG PO TABS: 20.0000 mg | ORAL_TABLET | Freq: Every day | ORAL | 3 refills | Status: DC

## 2024-04-06 NOTE — Progress Notes (Signed)
 Established patient visit   Patient: Teresa Torres   DOB: [birth year]   73 y.o. Female  MRN: [id number] Visit Date: 04/06/2024  Today's healthcare provider: Ronnald Ramp, MD   Chief Complaint  Patient presents with   Follow-up   Hypertension    Pt takes log at home 130-135/70-72 at home,  Diet- salads and a lot of fruit, not a lot of meat, limited sodium in diet, pt had been walking regularly but slightly less now with the pollen   Subjective     HPI     Hypertension    Additional comments: Pt takes log at home 130-135/70-72 at home,  Diet- salads and a lot of fruit, not a lot of meat, limited sodium in diet, pt had been walking regularly but slightly less now with the pollen      Last edited by Allayne Stack on 04/06/2024 11:04 AM.       Discussed the use of AI scribe software for clinical note transcription with the patient, who gave verbal consent to proceed.  History of Present Illness Teresa Torres is a 73 year old female who presents for a follow-up visit.  Hypertension is well controlled with losartan 25 mg daily. Blood pressure readings are stable and within the target range. She maintains a blood pressure log and reports favorable numbers.  Hyperlipidemia is managed with Lipitor 20 mg daily, and lipid levels remain stable. She continues this medication without issues.  She has experienced recurrent sinus infections, with one occurring in January and another a few weeks ago. Due to an allergy to aspirin, she did not take Singulair. Instead, she uses Claritin for allergy management, although it causes nasal dryness. She lives in an area with high pollen, which exacerbates her allergies, describing the pollen as pervasive and affecting her surroundings, including her outdoor cats.  GERD is managed with Dexilant 60 mg daily, which effectively controls her symptoms.  Vaginal atrophy is treated with estradiol cream, and symptoms are well managed with this  therapy.  She is considering pneumococcal, tetanus, and Shingrix vaccines but prefers to schedule them on a day when she is not working the following day due to potential side effects.     Past Medical History:  Diagnosis Date   Allergy 1980   See chart   Arthritis    of spine   Basal cell carcinoma 04/23/2022   Right low back above waistline, ED[facility]   GERD (gastroesophageal reflux disease) 1986   See chart   Hiatal hernia    Hyperlipidemia    Hypertension 2022   See chart    Medications: Outpatient Medications Prior to Visit  Medication Sig Note   dexlansoprazole (DEXILANT) 60 MG capsule Take 1 capsule (60 mg total) by mouth daily.    estradiol (ESTRACE) 0.1 MG/GM vaginal cream Place 0.5 g vaginally 2 (two) times a week. Place 0.5g nightly for two weeks then twice a week after    hydrocortisone 2.5 % cream Apply to aa's face and ears QD on Monday, Wednesday, and Friday    ketoconazole (NIZORAL) 2 % cream Apply to aa's ears and face QD on Tuesday, Thursday, and Saturday.    Lactobacillus (AZO COMPLETE FEMININE BALANCE PO) Take 1 tablet by mouth daily.    loratadine (CLARITIN) 10 MG tablet Take 10 mg by mouth daily. Every morning 5am    losartan (COZAAR) 25 MG tablet Take 1 tablet (25 mg total) by mouth daily.    Misc Natural  Products (NEURIVA PO) Take 1 tablet by mouth daily.    mupirocin ointment (BACTROBAN) 2 % Apply 1 Application topically 2 (two) times daily. To nostrils for 5 to 10 days    niacin 500 MG tablet Take 500 mg by mouth at bedtime.    [DISCONTINUED] atorvastatin (LIPITOR) 20 MG tablet TAKE 1 TABLET BY MOUTH EVERY DAY    [DISCONTINUED] montelukast (SINGULAIR) 10 MG tablet Take 1 tablet (10 mg total) by mouth at bedtime. (Patient not taking: Reported on 04/06/2024) 04/06/2024: Pt IS ALLERGIC TO ASPRIN   No facility-administered medications prior to visit.    Review of Systems  Last CBC Lab Results  Component Value Date   WBC 8.8 09/05/2022   HGB 14.4 09/05/2022    HCT 44.2 09/05/2022   MCV 89 09/05/2022   MCH 29.1 09/05/2022   RDW 13.0 09/05/2022   PLT 377 09/05/2022   Last metabolic panel Lab Results  Component Value Date   GLUCOSE 86 10/02/2023   NA 143 10/02/2023   K 4.4 10/02/2023   CL 105 10/02/2023   CO2 27 10/02/2023   BUN 15 10/02/2023   CREATININE 0.85 10/02/2023   EGFR 74 10/02/2023   CALCIUM 9.4 10/02/2023   PROT 6.4 04/03/2023   ALBUMIN 4.3 04/03/2023   LABGLOB 2.1 04/03/2023   AGRATIO 2.0 04/03/2023   BILITOT 0.4 04/03/2023   ALKPHOS 73 04/03/2023   AST 22 04/03/2023   ALT 18 04/03/2023   ANIONGAP 7 09/02/2013   Last lipids Lab Results  Component Value Date   CHOL 183 04/03/2023   HDL 75 04/03/2023   LDLCALC 94 04/03/2023   TRIG 79 04/03/2023   CHOLHDL 2.4 04/03/2023   Last hemoglobin A1c No results found for: "HGBA1C" Last thyroid functions Lab Results  Component Value Date   TSH 2.140 09/05/2022   Last vitamin D No results found for: "25OHVITD2", "25OHVITD3", "VD25OH" Last vitamin B12 and Folate No results found for: "VITAMINB12", "FOLATE"      Objective    BP 135/73   Pulse 70   Ht 5\' 1"  (1.549 m)   Wt 165 lb 12.8 oz (75.2 kg)   SpO2 100%   BMI 31.33 kg/m  BP Readings from Last 3 Encounters:  04/06/24 135/73  03/21/24 (!) 142/74  01/10/24 134/82   Wt Readings from Last 3 Encounters:  04/06/24 165 lb 12.8 oz (75.2 kg)  03/21/24 196 lb (88.9 kg)  11/19/23 168 lb 8 oz (76.4 kg)        Physical Exam  General: Alert, no acute distress Cardio: Normal S1 and S2, RRR, no r/m/g Pulm: CTAB, normal work of breathing ABD: soft, abdomen is not distended, there is no tenderness to palpation, normal BS    No results found for any visits on 04/06/24.  Assessment & Plan     Problem List Items Addressed This Visit       Cardiovascular and Mediastinum   Primary hypertension - Primary   Chronic and well-controlled hypertension with blood pressure readings within target range. - Continue  losartan 25 mg daily      Relevant Medications   atorvastatin (LIPITOR) 20 MG tablet     Digestive   Acid reflux   Chronic GERD with symptoms controlled by current treatment. - Continue Dexilant 60 mg daily - continue to follow up with GI         Other   Nasal congestion   Recurrent sinus infections with recent episodes in January and a few weeks ago. Allergy  season and environmental factors may contribute to symptoms. Claritin is preferred for allergy management due to aspirin allergy. - Use Claritin for allergy management - Use nasal moisturizer if nostrils become dry      HYPERLIPIDEMIA, MIXED   Chronic and well-controlled hyperlipidemia with stable lipid levels on current medication regimen. - Continue Lipitor 20 mg daily - Refill Lipitor prescription to ensure continuous supply      Relevant Medications   atorvastatin (LIPITOR) 20 MG tablet    Assessment & Plan  Vaginal Atrophy Chronic condition managed with topical hormone therapy, with symptoms controlled. - Continue estradiol cream as prescribed  General Health Maintenance Due for colon cancer screening and vaccinations. Discussed Shingrix vaccine side effects, including flu-like symptoms, and the need for two doses two months apart. Pneumococcal and tetanus vaccines were also discussed. Prefers to schedule vaccinations on a day off work to monitor for reactions. - Schedule colon cancer screening in August, September, or October - Administer pneumococcal vaccine today - Discuss scheduling Shingrix and tetanus vaccines on a day off work     Return in about 6 months (around 10/06/2024) for CPE.         Ronnald Ramp, MD  [facility] [phone] (phone) [phone] (fax)  [facility]

## 2024-04-06 NOTE — Assessment & Plan Note (Signed)
 Chronic and well-controlled hypertension with blood pressure readings within target range. - Continue losartan 25 mg daily

## 2024-04-06 NOTE — Assessment & Plan Note (Signed)
 Chronic and well-controlled hyperlipidemia with stable lipid levels on current medication regimen. - Continue Lipitor 20 mg daily - Refill Lipitor prescription to ensure continuous supply

## 2024-04-06 NOTE — Assessment & Plan Note (Signed)
 Recurrent sinus infections with recent episodes in January and a few weeks ago. Allergy season and environmental factors may contribute to symptoms. Claritin is preferred for allergy management due to aspirin allergy. - Use Claritin for allergy management - Use nasal moisturizer if nostrils become dry

## 2024-04-06 NOTE — Addendum Note (Signed)
 Addended by: Bing Neighbors on: 04/06/2024 12:28 PM   Modules accepted: Orders

## 2024-04-06 NOTE — Assessment & Plan Note (Signed)
 Chronic GERD with symptoms controlled by current treatment. - Continue Dexilant 60 mg daily - continue to follow up with GI

## 2024-05-06 ENCOUNTER — Encounter: Payer: Self-pay | Admitting: Family Medicine

## 2024-05-06 DIAGNOSIS — Z23 Encounter for immunization: Secondary | ICD-10-CM | POA: Diagnosis not present

## 2024-05-06 NOTE — Telephone Encounter (Signed)
 Called and spoke to the pt, to inform her the vaccine log has been updated and there is not a second one, she stated she understood and had no other questions

## 2024-08-09 ENCOUNTER — Encounter: Payer: Self-pay | Admitting: Family Medicine

## 2024-08-09 DIAGNOSIS — N952 Postmenopausal atrophic vaginitis: Secondary | ICD-10-CM

## 2024-08-10 MED ORDER — ESTRADIOL 0.1 MG/GM VA CREA: 0.5000 g | TOPICAL_CREAM | VAGINAL | 11 refills | Status: AC

## 2024-08-15 ENCOUNTER — Other Ambulatory Visit: Payer: Self-pay | Admitting: Family Medicine

## 2024-08-15 DIAGNOSIS — Z1231 Encounter for screening mammogram for malignant neoplasm of breast: Secondary | ICD-10-CM

## 2024-08-24 ENCOUNTER — Ambulatory Visit: Payer: No Typology Code available for payment source | Admitting: Dermatology

## 2024-09-06 ENCOUNTER — Ambulatory Visit: Admitting: Dermatology

## 2024-09-06 ENCOUNTER — Encounter: Payer: Self-pay | Admitting: Dermatology

## 2024-09-06 DIAGNOSIS — L814 Other melanin hyperpigmentation: Secondary | ICD-10-CM

## 2024-09-06 DIAGNOSIS — Z1283 Encounter for screening for malignant neoplasm of skin: Secondary | ICD-10-CM

## 2024-09-06 DIAGNOSIS — L578 Other skin changes due to chronic exposure to nonionizing radiation: Secondary | ICD-10-CM | POA: Diagnosis not present

## 2024-09-06 DIAGNOSIS — L821 Other seborrheic keratosis: Secondary | ICD-10-CM | POA: Diagnosis not present

## 2024-09-06 DIAGNOSIS — D229 Melanocytic nevi, unspecified: Secondary | ICD-10-CM

## 2024-09-06 DIAGNOSIS — W908XXA Exposure to other nonionizing radiation, initial encounter: Secondary | ICD-10-CM

## 2024-09-06 DIAGNOSIS — Z79899 Other long term (current) drug therapy: Secondary | ICD-10-CM

## 2024-09-06 DIAGNOSIS — Z85828 Personal history of other malignant neoplasm of skin: Secondary | ICD-10-CM

## 2024-09-06 DIAGNOSIS — L820 Inflamed seborrheic keratosis: Secondary | ICD-10-CM | POA: Diagnosis not present

## 2024-09-06 DIAGNOSIS — L219 Seborrheic dermatitis, unspecified: Secondary | ICD-10-CM

## 2024-09-06 DIAGNOSIS — D239 Other benign neoplasm of skin, unspecified: Secondary | ICD-10-CM

## 2024-09-06 DIAGNOSIS — D485 Neoplasm of uncertain behavior of skin: Secondary | ICD-10-CM

## 2024-09-06 DIAGNOSIS — D492 Neoplasm of unspecified behavior of bone, soft tissue, and skin: Secondary | ICD-10-CM

## 2024-09-06 DIAGNOSIS — D225 Melanocytic nevi of trunk: Secondary | ICD-10-CM

## 2024-09-06 DIAGNOSIS — Z7189 Other specified counseling: Secondary | ICD-10-CM

## 2024-09-06 HISTORY — DX: Other benign neoplasm of skin, unspecified: D23.9

## 2024-09-06 MED ORDER — HYDROCORTISONE 2.5 % EX CREA: TOPICAL_CREAM | Freq: Two times a day (BID) | CUTANEOUS | 11 refills | Status: DC | PRN

## 2024-09-06 MED ORDER — KETOCONAZOLE 2 % EX CREA: TOPICAL_CREAM | CUTANEOUS | 11 refills | Status: AC

## 2024-09-06 NOTE — Patient Instructions (Addendum)

## 2024-09-06 NOTE — Progress Notes (Signed)
 Follow-Up Visit   Subjective  Teresa Torres is a 73 y.o. female who presents for the following: Skin Cancer Screening and Full Body Skin Exam, hx of BCC  The patient presents for Total-Body Skin Exam (TBSE) for skin cancer screening and mole check. The patient has spots, moles and lesions to be evaluated, some may be new or changing and the patient may have concern these could be cancer.  The following portions of the chart were reviewed this encounter and updated as appropriate: medications, allergies, medical history  Review of Systems:  No other skin or systemic complaints except as noted in HPI or Assessment and Plan.  Objective  Well appearing patient in no apparent distress; mood and affect are within normal limits.  A full examination was performed including scalp, head, eyes, ears, nose, lips, neck, chest, axillae, abdomen, back, buttocks, bilateral upper extremities, bilateral lower extremities, hands, feet, fingers, toes, fingernails, and toenails. All findings within normal limits unless otherwise noted below.   Relevant physical exam findings are noted in the Assessment and Plan.  left superior medial buttock/sacral 0.8 x 0.6 cm irregular brown  left medial breast x 1, right inferior breast x 1, right popliteal x 1 (3) Stuck-on, waxy, tan-brown papules and plaques -- Discussed benign etiology and prognosis.   Assessment & Plan   SKIN CANCER SCREENING PERFORMED TODAY.  ACTINIC DAMAGE - Chronic condition, secondary to cumulative UV/sun exposure - diffuse scaly erythematous macules with underlying dyspigmentation - Recommend daily broad spectrum sunscreen SPF 30+ to sun-exposed areas, reapply every 2 hours as needed.  - Staying in the shade or wearing long sleeves, sun glasses (UVA+UVB protection) and wide brim hats (4-inch brim around the entire circumference of the hat) are also recommended for sun protection.  - Call for new or changing lesions.  LENTIGINES,  SEBORRHEIC KERATOSES, HEMANGIOMAS - Benign normal skin lesions - Benign-appearing - Call for any changes  MELANOCYTIC NEVI - Tan-brown and/or pink-flesh-colored symmetric macules and papules - Benign appearing on exam today - Observation - Call clinic for new or changing moles - Recommend daily use of broad spectrum spf 30+ sunscreen to sun-exposed areas.   HISTORY OF BASAL CELL CARCINOMA OF THE SKIN - No evidence of recurrence today - Recommend regular full body skin exams - Recommend daily broad spectrum sunscreen SPF 30+ to sun-exposed areas, reapply every 2 hours as needed.  - Call if any new or changing lesions are noted between office visits  NEOPLASM OF SKIN left superior medial buttock/sacral Epidermal / dermal shaving  Lesion diameter (cm):  0.8 Informed consent: discussed and consent obtained   Timeout: patient name, date of birth, surgical site, and procedure verified   Procedure prep:  Patient was prepped and draped in usual sterile fashion Prep type:  Isopropyl alcohol Anesthesia: the lesion was anesthetized in a standard fashion   Anesthetic:  1% lidocaine w/ epinephrine 1-100,000 buffered w/ 8.4% NaHCO3 Hemostasis achieved with: pressure, aluminum chloride and electrodesiccation   Outcome: patient tolerated procedure well   Post-procedure details: sterile dressing applied and wound care instructions given   Dressing type: bandage and petrolatum    Specimen 1 - Surgical pathology Differential Diagnosis: R/O Dysplastic nevus   Check Margins: No INFLAMED SEBORRHEIC KERATOSIS (3) left medial breast x 1, right inferior breast x 1, right popliteal x 1 (3) Symptomatic, irritating, patient would like treated.  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 Exam: Pink patches with greasy scale at the face and ears Chronic and persistent condition with duration or expected duration over one year. Condition is symptomatic/ bothersome to patient. Not currently at goal. Seborrheic Dermatitis is a chronic persistent rash characterized by pinkness and scaling most commonly of the mid face but also can occur on the scalp (dandruff), ears; mid chest, mid back and groin.  It tends to be exacerbated by stress and cooler weather.  People who have neurologic disease may experience new onset or exacerbation of existing seborrheic dermatitis.  The condition is not curable but treatable and can be controlled. Treatment Plan: Continue  HC 2.5% cream QD on M,W,F alternating with Ketoconazole  2% cream QD on T, Th, Sat  Return in about 1 year (around 09/06/2025) for TBSE, hx of BCC.  IFay Kirks, CMA, am acting as scribe for Alm Rhyme, MD .   Documentation: I have reviewed the above documentation for accuracy and completeness, and I agree with the above.  Alm Rhyme, MD

## 2024-09-08 LAB — SURGICAL PATHOLOGY

## 2024-09-09 ENCOUNTER — Ambulatory Visit: Payer: Self-pay | Admitting: Dermatology

## 2024-09-09 ENCOUNTER — Encounter

## 2024-09-12 ENCOUNTER — Encounter: Payer: Self-pay | Admitting: Dermatology

## 2024-09-12 NOTE — Telephone Encounter (Signed)
-----   Message from Alm Rhyme sent at 09/09/2024  2:06 PM EDT ----- FINAL DIAGNOSIS        1. Skin, left superior medial buttock/sacral :       DYSPLASTIC JUNCTIONAL NEVUS WITH MODERATE ATYPIA, LIMITED MARGINS FREE   Moderate dysplastic Recheck next visit ----- Message ----- From: Interface, [facility] One Sent: 09/08/2024   9:42 PM EDT To: Alm JAYSON Rhyme, MD

## 2024-09-12 NOTE — Telephone Encounter (Signed)
 Discussed biopsy results with patient  Skin, left superior medial buttock/sacral :       DYSPLASTIC JUNCTIONAL NEVUS WITH MODERATE ATYPIA, LIMITED MARGINS FREE

## 2024-09-23 ENCOUNTER — Other Ambulatory Visit: Payer: Self-pay

## 2024-09-23 ENCOUNTER — Encounter: Payer: Self-pay | Admitting: Emergency Medicine

## 2024-09-23 ENCOUNTER — Ambulatory Visit
Admission: EM | Admit: 2024-09-23 | Discharge: 2024-09-23 | Disposition: A | Discharge status: Other Acute Inpatient Hospital | Attending: Emergency Medicine | Admitting: Emergency Medicine

## 2024-09-23 ENCOUNTER — Emergency Department
Admission: EM | Admit: 2024-09-23 | Discharge: 2024-09-23 | Disposition: A | Discharge status: Home / Self Care | Attending: Emergency Medicine | Admitting: Emergency Medicine

## 2024-09-23 DIAGNOSIS — Z91030 Bee allergy status: Secondary | ICD-10-CM | POA: Diagnosis not present

## 2024-09-23 DIAGNOSIS — R1310 Dysphagia, unspecified: Secondary | ICD-10-CM

## 2024-09-23 DIAGNOSIS — R0789 Other chest pain: Secondary | ICD-10-CM

## 2024-09-23 DIAGNOSIS — L509 Urticaria, unspecified: Secondary | ICD-10-CM | POA: Diagnosis present

## 2024-09-23 DIAGNOSIS — T7840XA Allergy, unspecified, initial encounter: Secondary | ICD-10-CM | POA: Diagnosis not present

## 2024-09-23 DIAGNOSIS — T63461A Toxic effect of venom of wasps, accidental (unintentional), initial encounter: Secondary | ICD-10-CM

## 2024-09-23 DIAGNOSIS — R0602 Shortness of breath: Secondary | ICD-10-CM

## 2024-09-23 MED ORDER — PREDNISONE 20 MG PO TABS: 40.0000 mg | ORAL_TABLET | Freq: Every day | ORAL | 0 refills | Status: AC

## 2024-09-23 MED ORDER — PREDNISONE 20 MG PO TABS
40.0000 mg | ORAL_TABLET | Freq: Once | ORAL | Status: AC
  Administered 2024-09-23: 40 mg via ORAL
  Filled 2024-09-23: qty 2

## 2024-09-23 MED ORDER — EPINEPHRINE 0.3 MG/0.3ML IJ SOAJ
0.3000 mg | Freq: Once | INTRAMUSCULAR | Status: AC
  Administered 2024-09-23: 0.3 mg via INTRAMUSCULAR

## 2024-09-23 MED ORDER — EPINEPHRINE 0.3 MG/0.3ML IJ SOAJ: 0.3000 mg | Freq: Once | INTRAMUSCULAR | 0 refills | Status: AC

## 2024-09-23 NOTE — ED Triage Notes (Signed)
 Pt to ED via [facility] from urgent care for Allergic Reaction. Pt was stung by a yellow jacket on her left wrist. Pt started having hives and shortness of breath. Pt was given 0.3 mg o epi IM, EMS gave 50 mg of benadryl, 20 mg of pepcid, and 125 mg of sol u medrol. Pt has 20 G IV in her RAC. Pt states that she is feeling much better now.

## 2024-09-23 NOTE — ED Triage Notes (Signed)
 Patient having an allergic reaction to yellow jacket sting about 1 hour ago. . Patient complains Chest tightness, SOB and difficulty swallowing.

## 2024-09-23 NOTE — ED Notes (Signed)
 Patient is being discharged from the Urgent Care and sent to the Emergency Department via EMS . Per A.White,NP, patient is in need of higher level of care due to allergic reaction. Patient is aware and verbalizes understanding of plan of care.  Vitals:   09/23/24 1726  BP: (!) 163/89  Pulse: 93  Resp: (!) 26  Temp: 97.7 F (36.5 C)  SpO2: 98%

## 2024-09-24 NOTE — ED Provider Notes (Signed)
 [facility] Provider Note    Event Date/Time   First MD Initiated Contact with Patient 09/23/24 1802     (approximate)   History   Allergic Reaction   HPI  Teresa Torres is a 73 y.o. female reports in her good health.  She was stung on her left forearm by a yellowjacket.  Within about 20 to 30 minutes she noticed it felt like she was having a little bit of a feeling of slight swelling in her tongue and then started feeling short of breath.  She then also started developing hives.  EMS was called from urgent care.  She has been treated now with epinephrine  1 dose, Benadryl, Pepcid and Solu-Medrol.  Patient ambulates from the bathroom into the room.  She reports she feels much better and her symptoms have essentially gone away with exception to slight swelling right at the sting site.  She is certain it was a yellowjacket and saw it.  She does not have any known history of anaphylaxis or allergies to stings     Physical Exam   Triage Vital Signs: ED Triage Vitals  Encounter Vitals Group     BP 09/23/24 1809 (!) 159/107     Girls Systolic BP Percentile --      Girls Diastolic BP Percentile --      Boys Systolic BP Percentile --      Boys Diastolic BP Percentile --      Pulse Rate 09/23/24 1809 90     Resp 09/23/24 1809 18     Temp 09/23/24 1814 97.6 F (36.4 C)     Temp Source 09/23/24 1809 Oral     SpO2 09/23/24 1809 100 %     Weight 09/23/24 1806 165 lb (74.8 kg)     Height 09/23/24 1806 5' 1 (1.549 m)     Head Circumference --      Peak Flow --      Pain Score 09/23/24 1805 0     Pain Loc --      Pain Education --      Exclude from Growth Chart --     Most recent vital signs: Vitals:   09/23/24 2200 09/23/24 2230  BP: (!) 153/82 (!) 147/78  Pulse: 72 79  Resp: 19 15  Temp:    SpO2: 96% 97%     General: Awake, no distress.  Ambulatory without distress CV:  Good peripheral perfusion.  Normal tones and rate Resp:  Normal effort.   Clear lungs bilateral Abd:  No distention.  Other:  Oropharynx widely patent.  Slight edema and mild erythema area about the size of a half dollar over the left forearm where she reports being stung.  No stinger evident.  Mild localized reaction.  Very slight urticarial rash on the abdomen  Clear speech no difficulty swallowing very pleasant   ED Results / Procedures / Treatments   Labs (all labs ordered are listed, but only abnormal results are displayed) Labs Reviewed - No data to display   EKG     RADIOLOGY     PROCEDURES:  Critical Care performed: No  Procedures   MEDICATIONS ORDERED IN ED: Medications  predniSONE  (DELTASONE ) tablet 40 mg (40 mg Oral Given 09/23/24 2254)     IMPRESSION / MDM / ASSESSMENT AND PLAN / ED COURSE  I reviewed the triage vital signs and the nursing notes.  Differential diagnosis includes, but is not limited to, anaphylaxis/allergic reaction due to hymenoptera sting, other allergy, allergic reaction, localized swelling about the left forearm at sting site  Patient observed in the ER, no further symptoms after observation for 3+ hours.  Asymptomatic.  Discussed with patient discussed use of epinephrine  pen, and she will take Claritin which she is familiar with daily, and will do additional 4 days of prednisone .    Patient's presentation is most consistent with acute complicated illness / injury requiring diagnostic workup.      Clinical Course as of 09/24/24 0125  Fri Sep 23, 2024  2252 Resting comfortably breathing easily no edema no complaints.  Very slight erythema and swelling at site of insect sting.  At this juncture secondary to inflammation, no evidence that would support cellulitis [MQ]  2252 Discussed with patient will discharge with epinephrine  pen, traditional return precautions.  She will take Claritin daily which she is familiar with as well as prednisone  for the next 4.  Follow-up with  primary care [MQ]  2252 Return precautions and treatment recommendations and follow-up discussed with the patient who is agreeable with the plan.  [MQ]    Clinical Course User Index [MQ] Dicky Anes, MD     FINAL CLINICAL IMPRESSION(S) / ED DIAGNOSES   Final diagnoses:  Bee sting allergy  Allergic reaction, initial encounter     Rx / DC Orders   ED Discharge Orders          Ordered    predniSONE  (DELTASONE ) 20 MG tablet  Daily with breakfast        09/23/24 2251    EPINEPHrine  0.3 mg/0.3 mL IJ SOAJ injection   Once        09/23/24 2251             Note:  This document was prepared using Dragon voice recognition software and may include unintentional dictation errors.   Dicky Anes, MD 09/24/24 [phone]

## 2024-09-26 ENCOUNTER — Encounter: Payer: Self-pay | Admitting: Emergency Medicine

## 2024-09-26 ENCOUNTER — Other Ambulatory Visit: Payer: Self-pay

## 2024-09-26 ENCOUNTER — Emergency Department
Admission: EM | Admit: 2024-09-26 | Discharge: 2024-09-26 | Disposition: A | Discharge status: Home / Self Care | Attending: Emergency Medicine | Admitting: Emergency Medicine

## 2024-09-26 DIAGNOSIS — L509 Urticaria, unspecified: Secondary | ICD-10-CM | POA: Insufficient documentation

## 2024-09-26 MED ORDER — PREDNISONE 10 MG (21) PO TBPK: ORAL_TABLET | ORAL | 0 refills | Status: DC

## 2024-09-26 MED ORDER — DIPHENHYDRAMINE HCL 50 MG/ML IJ SOLN
25.0000 mg | Freq: Once | INTRAMUSCULAR | Status: AC
  Administered 2024-09-26: 25 mg via INTRAVENOUS
  Filled 2024-09-26: qty 1

## 2024-09-26 MED ORDER — FAMOTIDINE IN NACL 20-0.9 MG/50ML-% IV SOLN
20.0000 mg | Freq: Once | INTRAVENOUS | Status: AC
  Administered 2024-09-26: 20 mg via INTRAVENOUS
  Filled 2024-09-26: qty 50

## 2024-09-26 NOTE — ED Provider Notes (Signed)
   [facility] Provider Note    Event Date/Time   First MD Initiated Contact with Patient 09/26/24 [phone]     (approximate)   History   Urticaria   HPI  Teresa Torres is a 73 y.o. female  who presents to the emergency department today because of concern for recurring hives. The patient had been seen in the emergency department 3 days ago for an anaphylactic reaction after a yellow jacket sting to her left wrist. She was treated with epi and steroids. Yesterday noticed some hives to her arms, this morning however she not only noticed hives and itchiness to her arms but to her legs as well. The patient denies any swelling in her throat or difficulty breathing.     Physical Exam   Triage Vital Signs: ED Triage Vitals [09/26/24 0527]  Encounter Vitals Group     BP (!) 197/89     Girls Systolic BP Percentile      Girls Diastolic BP Percentile      Boys Systolic BP Percentile      Boys Diastolic BP Percentile      Pulse Rate 79     Resp 17     Temp 98.5 F (36.9 C)     Temp Source Oral     SpO2 97 %     Weight 165 lb (74.8 kg)     Height 5' 2 (1.575 m)     Head Circumference      Peak Flow      Pain Score      Pain Loc      Pain Education      Exclude from Growth Chart     Most recent vital signs: Vitals:   09/26/24 0527  BP: (!) 197/89  Pulse: 79  Resp: 17  Temp: 98.5 F (36.9 C)  SpO2: 97%   General: Awake, alert, oriented. CV:  Good peripheral perfusion. Regular rate and rhythm. Resp:  Normal effort. Lungs clear. Abd:  No distention.  Other:  Hives to extremities.    ED Results / Procedures / Treatments   Labs (all labs ordered are listed, but only abnormal results are displayed) Labs Reviewed - No data to display   EKG  None   RADIOLOGY None   PROCEDURES:  Critical Care performed: No    MEDICATIONS ORDERED IN ED: Medications - No data to display   IMPRESSION / MDM / ASSESSMENT AND PLAN / ED COURSE  I reviewed  the triage vital signs and the nursing notes.                              Differential diagnosis includes, but is not limited to, allergic reaction, dermatitis.   Patient's presentation is most consistent with acute presentation with potential threat to life or bodily function.  Patient presented to the emergency department for recurrent hives. On exam hives to extremities. No oropharyngeal swelling. No SOB. Patient took a dose of steroid a couple of hours ago. Will give iv benadryl and pepcid here. Will observe. Did discuss with patient if she had any shortness of breath we would have to give epi.      FINAL CLINICAL IMPRESSION(S) / ED DIAGNOSES   Final diagnoses:  Urticaria     Note:  This document was prepared using Dragon voice recognition software and may include unintentional dictation errors.    Floy Roberts, MD 09/26/24 [phone]

## 2024-09-26 NOTE — ED Triage Notes (Signed)
 Arrived ambulatory to triage for hives  Per pt I was seen here on Friday for an allergic reaction and they told me that if it started to happen again to come back. The rash started back up a little on my arms Saturday, but this morning is all over my body.  Denies shob, respirations even and unlabored, nadn

## 2024-09-26 NOTE — ED Provider Notes (Signed)
 Care of this patient assumed from prior physician at 0730 pending continued observation and disposition. Please see prior physician note for further details.  Briefly this is a 74 year old female with recent admission following an anaphylactic reaction presenting back to the ER for worsening hives and itchiness.  Took prednisone  prior to presentation.  Given Benadryl and Pepcid here.  Without evidence of anaphylaxis.  Signed out to me pending 4-hour observation to ensure patient does not have worsening symptoms.  After 4 hours in the department, patient was reassessed.  She did have some persistent hives, but reported improvement in her itching.  No cardiorespiratory symptoms, GI symptoms.  Remains without evidence of anaphylaxis.  She has EpiPen 's at home and understands indication to use them.  Steroid pack sent by prior physician.  Patient is comfortable discharge home.  Strict return precautions provided.  Patient discharged in stable condition.   Levander Slate, MD 09/26/24 [phone]

## 2024-09-26 NOTE — Discharge Instructions (Addendum)
 Take your medications as prescribed.  Return to the ER for new or worsening symptoms.

## 2024-10-02 ENCOUNTER — Other Ambulatory Visit: Payer: Self-pay | Admitting: Family Medicine

## 2024-10-02 DIAGNOSIS — E782 Mixed hyperlipidemia: Secondary | ICD-10-CM

## 2024-10-06 ENCOUNTER — Other Ambulatory Visit: Payer: Self-pay | Admitting: Medical Genetics

## 2024-10-06 ENCOUNTER — Telehealth: Payer: Self-pay

## 2024-10-06 NOTE — Telephone Encounter (Signed)
 The patient called to schedule a follow-up appointment for a medication refill and to arrange a colonoscopy. I explained that Dr. Jinny is no longer seeing patients in the clinic, as he is now working as a hospitalist and only sees inpatients. However, he is currently performing colonoscopies. I advised the patient that Dr. Jinny may be able to perform her colonoscopy, but she will need to discuss this with her current provider.

## 2024-10-07 ENCOUNTER — Ambulatory Visit
Admission: RE | Admit: 2024-10-07 | Discharge: 2024-10-07 | Disposition: A | Discharge status: Home / Self Care | Source: Ambulatory Visit | Attending: Family Medicine | Admitting: Family Medicine

## 2024-10-07 DIAGNOSIS — Z1231 Encounter for screening mammogram for malignant neoplasm of breast: Secondary | ICD-10-CM | POA: Diagnosis present

## 2024-10-10 ENCOUNTER — Other Ambulatory Visit: Payer: Self-pay

## 2024-10-11 NOTE — Progress Notes (Unsigned)
 10/12/2024 Teresa Torres [id number] [birth year]  Gastroenterology Office Note    Referring Provider: Sharma Bullocks* Primary Care Physician:  Sharma Coyer, MD  Primary GI Provider: Jinny Carmine, MD    Chief Complaint   Chief Complaint  Patient presents with   Follow-up    Medication refill /colonoscopy-Deilant- having gerd episodes after eating     History of Present Illness   Teresa Torres is a 73 y.o. female with PMHX of reflux, presenting today for medication refill and having breakthrough GERD episodes.   Patien last seen by Dr. Jinny on 11/19/2023 for medication refill.   Patient reports having long history of reflux and a large hiatal hernia, last upper endoscopy over 7 years ago.  She is on Dexilant  and reports overall this works well for her but has more episodes of breakthrough symptoms at night.  She has learned that she cannot eat after 5 PM, if she does eat late she is up all night with increased heartburn and feels like everything is coming back up her throat. She reports that she has a large hitial hernia, thinks last upper endoscopy was over 7 years ago with [facility]. She tries to avoid trigger foods, fried foods.  Denies vomiting or unintentional weight loss.   She states she is due for her colonoscopy.  Denies issues with her bowels.  No melena or hematochezia.  No history of colon polyps or family history of colon cancer/polyps.   Past Medical History:  Diagnosis Date   Allergy 1980   See chart   Arthritis    of spine   Basal cell carcinoma 04/23/2022   Right low back above waistline, EDC   Dysplastic nevus 09/06/2024   left superior medial buttock/sacral,mod atypia   GERD (gastroesophageal reflux disease) 1986   See chart   Hiatal hernia    Hyperlipidemia    Hypertension 2022   See chart    Past Surgical History:  Procedure Laterality Date   ABDOMINAL HYSTERECTOMY  1984   Partial   BREAST BIOPSY Right 1972   EXCISIONAL -  NEG   CHOLECYSTECTOMY  2007-2008   KNEE SURGERY  1980   TONSILLECTOMY  1961    Current Outpatient Medications  Medication Sig Dispense Refill   atorvastatin  (LIPITOR) 20 MG tablet TAKE 1 TABLET BY MOUTH EVERY DAY 90 tablet 2   dexlansoprazole  (DEXILANT ) 60 MG capsule Take 1 capsule (60 mg total) by mouth daily. 90 capsule 3   EPINEPHrine  0.3 mg/0.3 mL IJ SOAJ injection Inject into the muscle.     estradiol  (ESTRACE ) 0.1 MG/GM vaginal cream Place 0.5 g vaginally 2 (two) times a week. Place 0.5g nightly for two weeks then twice a week after 30 g 11   hydrocortisone  2.5 % cream Apply to aa's face and ears QD on Monday, Wednesday, and Friday 28 g 5   ketoconazole  (NIZORAL ) 2 % cream Apply to aa's ears and face QD on Tuesday, Thursday, and Saturday. 60 g 11   Lactobacillus (AZO COMPLETE FEMININE BALANCE PO) Take 1 tablet by mouth daily.     loratadine (CLARITIN) 10 MG tablet Take 10 mg by mouth daily. Every morning 5am     losartan  (COZAAR ) 25 MG tablet Take 1 tablet (25 mg total) by mouth daily. 90 tablet 2   Misc Natural Products (NEURIVA PO) Take 1 tablet by mouth daily.     niacin 500 MG tablet Take 500 mg by mouth at bedtime.     No current facility-administered  medications for this visit.    Allergies as of 10/12/2024 - Review Complete 10/12/2024  Allergen Reaction Noted   Bee venom Anaphylaxis 09/26/2024   Compazine [prochlorperazine edisylate] Anaphylaxis 08/13/2015   Fish allergy Anaphylaxis 09/26/2024   Shellfish allergy Anaphylaxis 09/26/2024   Lisinopril  Cough 03/03/2023   Aspirin Nausea And Vomiting 08/13/2015   Nitrofurantoin  Nausea And Vomiting 12/15/2016    Family History  Problem Relation Age of Onset   Hypertension Mother    Heart disease Mother    Arthritis Mother    Miscarriages / [place] Mother    Breast cancer Paternal Grandmother    Diabetes Maternal Grandmother     Social History   Socioeconomic History   Marital status: Married    Spouse name:  Teresa Torres   Number of children: Not on file   Years of education: Not on file   Highest education level: GED or equivalent  Occupational History   Not on file  Tobacco Use   Smoking status: Former   Smokeless tobacco: Never  Vaping Use   Vaping status: Never Used  Substance and Sexual Activity   Alcohol use: Yes    Alcohol/week: 6.0 standard drinks of alcohol    Types: 6 Glasses of wine per week   Drug use: Never   Sexual activity: Not Currently    Partners: Male  Other Topics Concern   Not on file  Social History Narrative   Not on file   Social Drivers of Health   Financial Resource Strain: Low Risk  (10/11/2024)   Overall Financial Resource Strain (CARDIA)    Difficulty of Paying Living Expenses: Not hard at all  Food Insecurity: No Food Insecurity (10/11/2024)   Hunger Vital Sign    Worried About Running Out of Food in the Last Year: Never true    Ran Out of Food in the Last Year: Never true  Transportation Needs: No Transportation Needs (10/11/2024)   PRAPARE - Administrator, Civil Service (Medical): No    Lack of Transportation (Non-Medical): No  Physical Activity: Insufficiently Active (10/11/2024)   Exercise Vital Sign    Days of Exercise per Week: 3 days    Minutes of Exercise per Session: 30 min  Stress: No Stress Concern Present (10/11/2024)   [employer] of Occupational Health - Occupational Stress Questionnaire    Feeling of Stress: Only a little  Social Connections: Socially Integrated (10/11/2024)   Social Connection and Isolation Panel    Frequency of Communication with Friends and Family: More than three times a week    Frequency of Social Gatherings with Friends and Family: Once a week    Attends Religious Services: More than 4 times per year    Active Member of Clubs or Organizations: Yes    Attends Banker Meetings: More than 4 times per year    Marital Status: Married  Catering manager Violence: Not on file      RELEVANT GI HISTORY, IMAGING AND LABS: CBC    Component Value Date/Time   WBC 8.8 09/05/2022 0940   WBC 13.4 (H) 09/02/2013 0617   RBC 4.95 09/05/2022 0940   RBC 5.04 09/02/2013 0617   HGB 14.4 09/05/2022 0940   HCT 44.2 09/05/2022 0940   PLT 377 09/05/2022 0940   MCV 89 09/05/2022 0940   MCV 85 09/02/2013 0617   MCH 29.1 09/05/2022 0940   MCH 29.7 09/02/2013 0617   MCHC 32.6 09/05/2022 0940   MCHC 34.9 09/02/2013 0617  RDW 13.0 09/05/2022 0940   RDW 14.2 09/02/2013 0617   LYMPHSABS 2.8 09/05/2022 0940   LYMPHSABS 1.3 09/02/2013 0617   MONOABS 0.6 09/02/2013 0617   EOSABS 0.2 09/05/2022 0940   EOSABS 0.0 09/02/2013 0617   BASOSABS 0.1 09/05/2022 0940   BASOSABS 0.0 09/02/2013 0617   No results for input(s): HGB in the last 8760 hours.  CMP     Component Value Date/Time   NA 143 10/02/2023 0949   NA 141 09/02/2013 0617   K 4.4 10/02/2023 0949   K 3.5 09/02/2013 0617   CL 105 10/02/2023 0949   CL 108 (H) 09/02/2013 0617   CO2 27 10/02/2023 0949   CO2 26 09/02/2013 0617   GLUCOSE 86 10/02/2023 0949   GLUCOSE 115 (H) 09/02/2013 0617   BUN 15 10/02/2023 0949   BUN 10 09/02/2013 0617   CREATININE 0.85 10/02/2023 0949   CREATININE 0.86 09/02/2013 0617   CALCIUM  9.4 10/02/2023 0949   CALCIUM  9.5 09/02/2013 0617   PROT 6.4 04/03/2023 0928   ALBUMIN 4.3 04/03/2023 0928   AST 22 04/03/2023 0928   ALT 18 04/03/2023 0928   ALKPHOS 73 04/03/2023 0928   BILITOT 0.4 04/03/2023 0928   GFRNONAA 65 06/15/2020 0944   GFRNONAA >60 09/02/2013 0617   GFRAA 75 06/15/2020 0944   GFRAA >60 09/02/2013 0617      Latest Ref Rng & Units 04/03/2023    9:28 AM 09/05/2022    9:40 AM 10/22/2021    9:12 AM  Hepatic Function  Total Protein 6.0 - 8.5 g/dL 6.4  6.8  6.1   Albumin 3.9 - 4.9 g/dL 4.3  4.7  4.1   AST 0 - 40 IU/L 22  22  15    ALT 0 - 32 IU/L 18  26  16    Alk Phosphatase 44 - 121 IU/L 73  60  65   Total Bilirubin 0.0 - 1.2 mg/dL 0.4  0.8  0.5       Review of  Systems   All systems reviewed and negative except where noted in HPI.    Physical Exam  There were no vitals taken for this visit. No LMP recorded. Patient has had a hysterectomy. General:   Alert and oriented. Pleasant and cooperative. Well-nourished and well-developed.  Head:  Normocephalic and atraumatic. Eyes:  Without icterus Ears:  Normal auditory acuity. Lungs:  Respirations even and unlabored.  Clear throughout to auscultation.   No wheezes, crackles, or rhonchi. No acute distress. Heart:  Regular rate and rhythm; no murmurs, clicks, rubs, or gallops. Abdomen:  Normal bowel sounds.  No bruits.  Soft, non-tender and non-distended without masses, hepatosplenomegaly or hernias noted.  No guarding or rebound tenderness.   Rectal:  Deferred. Msk:  Symmetrical without gross deformities. Normal posture. Extremities:  Without edema. Neurologic:  Alert and  oriented x4;  grossly normal neurologically. Skin:  Intact without significant lesions or rashes. Psych:  Alert and cooperative. Normal mood and affect.   Assessment & Plan   Teresa Torres is a 73 y.o. female presenting today for medication refill and to schedule colonoscopy.   GERD: Seems to do ok with Dexilant , has occasional beakthrough GERD symptoms at night. Hx of hiatal hernia. No recent EGD - continue lifestyle modifications - Refill Dexilant  for a year - Add on famotidine at night  - Will schedule EGD to evaluate for erosive esophagitis, Barrett's esophagus, malignancy.   Screening Colonoscopy: - Proceed with colonoscopy. The risks, benefits, and alternatives have  been discussed with the patient in detail, which include, but are not limited to: bleeding, infection, perforation & drug reaction. The patient states understanding and desires to proceed.   Follow up as needed.   Grayce Bohr, DNP, AGNP-C [facility]

## 2024-10-12 ENCOUNTER — Ambulatory Visit: Admitting: Family Medicine

## 2024-10-12 ENCOUNTER — Encounter: Payer: Self-pay | Admitting: Family Medicine

## 2024-10-12 VITALS — BP 146/80 | HR 81 | Temp 98.9°F | Ht 61.0 in | Wt 171.0 lb

## 2024-10-12 DIAGNOSIS — K219 Gastro-esophageal reflux disease without esophagitis: Secondary | ICD-10-CM | POA: Diagnosis not present

## 2024-10-12 DIAGNOSIS — Z1211 Encounter for screening for malignant neoplasm of colon: Secondary | ICD-10-CM

## 2024-10-12 MED ORDER — FAMOTIDINE 40 MG PO TABS: 40.0000 mg | ORAL_TABLET | Freq: Every day | ORAL | 1 refills | Status: DC

## 2024-10-12 MED ORDER — DEXLANSOPRAZOLE 60 MG PO CPDR: 60.0000 mg | DELAYED_RELEASE_CAPSULE | Freq: Every day | ORAL | 3 refills | Status: AC

## 2024-10-14 ENCOUNTER — Other Ambulatory Visit
Admission: RE | Admit: 2024-10-14 | Discharge: 2024-10-14 | Disposition: A | Payer: Self-pay | Source: Ambulatory Visit | Attending: Medical Genetics | Admitting: Medical Genetics

## 2024-10-14 ENCOUNTER — Encounter: Admitting: Family Medicine

## 2024-10-20 ENCOUNTER — Ambulatory Visit: Payer: Self-pay | Admitting: Family Medicine

## 2024-11-02 LAB — GENECONNECT MOLECULAR SCREEN: Genetic Analysis Overall Interpretation: NEGATIVE

## 2024-11-04 ENCOUNTER — Other Ambulatory Visit: Payer: Self-pay | Admitting: Family Medicine

## 2024-11-14 ENCOUNTER — Encounter: Payer: Self-pay | Admitting: Family Medicine

## 2024-11-14 ENCOUNTER — Ambulatory Visit: Admitting: Family Medicine

## 2024-11-14 VITALS — BP 154/82 | HR 83 | Temp 97.9°F | Ht 61.0 in | Wt 173.2 lb

## 2024-11-14 DIAGNOSIS — N3000 Acute cystitis without hematuria: Secondary | ICD-10-CM

## 2024-11-14 DIAGNOSIS — I10 Essential (primary) hypertension: Secondary | ICD-10-CM | POA: Diagnosis not present

## 2024-11-14 DIAGNOSIS — R1024 Suprapubic pain: Secondary | ICD-10-CM | POA: Diagnosis not present

## 2024-11-14 LAB — POCT URINALYSIS DIPSTICK
Bilirubin, UA: NEGATIVE
Blood, UA: NEGATIVE
Glucose, UA: NEGATIVE
Ketones, UA: NEGATIVE
Nitrite, UA: POSITIVE — AB
Odor: NORMAL
Protein, UA: POSITIVE — AB
Spec Grav, UA: 1.015 (ref 1.010–1.025)
Urobilinogen, UA: 0.2 U/dL
pH, UA: 6 (ref 5.0–8.0)

## 2024-11-14 MED ORDER — SULFAMETHOXAZOLE-TRIMETHOPRIM 800-160 MG PO TABS: 1.0000 | ORAL_TABLET | Freq: Two times a day (BID) | ORAL | 0 refills | Status: AC

## 2024-11-14 NOTE — Progress Notes (Signed)
 ACUTE VISIT   Patient: Teresa Torres   DOB: 1951-12-26   73 y.o. Female  MRN: [id number]   PCP: Sharma Coyer, MD  Chief Complaint  Patient presents with   Urinary Tract Infection    Burning with urination, pressure, feeling of not completely emptying x 5 days, took Azo to try relieve symptoms- helped some but still has syx. Azo test strip tested pos for wbc    Subjective    HPI HPI     Urinary Tract Infection    Additional comments: Burning with urination, pressure, feeling of not completely emptying x 5 days, took Azo to try relieve symptoms- helped some but still has syx. Azo test strip tested pos for wbc       Last edited by Cherry Chiquita HERO, CMA on 11/14/2024  8:32 AM.       Discussed the use of AI scribe software for clinical note transcription with the patient, who gave verbal consent to proceed.  History of Present Illness Teresa Torres is a 73 year old female with recurrent urinary tract infections who presents with urinary tract symptoms.  She began experiencing urinary tract symptoms on Wednesday night after returning from heart practice, describing a sensation of 'little pressure'. She has a history of recurrent urinary tract infections since age 12, with the first episode being severe enough to prevent urination. Although she has been prone to UTIs throughout her life, she has not had one for a while until now. Initially, she managed her symptoms with Azo, but after discontinuing it, her symptoms returned. She used Azo test strips, which tested positive for bacteria and white blood cells. Current symptoms include discomfort. A point-of-care urine analysis showed specific gravity of 1.015, trace protein, positive nitrites, and trace leukocytes. She has no known allergies to Bactrim  and has taken it before.  Her blood pressure was recorded at 178/86 during the visit, but she mentions that her home readings are generally lower, around 142/80, except during a  past incident involving a bee sting when it spiked to 200/97. She is currently taking losartan  25 mg for hypertension.  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 She manages her symptoms with Pepcid as needed, especially when eating out, to prevent reflux. She is concerned about the long-term effects of acid on her esophagus and stomach.  She is currently having work done on her house, which involves significant physical activity such as moving items up and down stairs.     Medications: Outpatient Medications Prior to Visit  Medication Sig   atorvastatin  (LIPITOR) 20 MG tablet TAKE 1 TABLET BY MOUTH EVERY DAY   dexlansoprazole  (DEXILANT ) 60 MG capsule Take 1 capsule (60 mg total) by mouth daily.   EPINEPHrine  0.3 mg/0.3 mL IJ SOAJ injection Inject into the muscle.   estradiol  (ESTRACE ) 0.1 MG/GM vaginal cream Place 0.5 g vaginally 2 (two) times a week. Place 0.5g nightly for two weeks then twice a week after   famotidine (PEPCID) 40 MG tablet TAKE 1 TABLET BY MOUTH EVERY DAY   hydrocortisone  2.5 % cream Apply to aa's face and ears QD on Monday, Wednesday, and Friday   ketoconazole  (NIZORAL ) 2 % cream Apply to aa's ears and face QD on Tuesday, Thursday, and Saturday.   Lactobacillus (AZO COMPLETE FEMININE BALANCE PO) Take 1 tablet by mouth daily.   loratadine (  CLARITIN) 10 MG tablet Take 10 mg by mouth daily. Every morning 5am   losartan  (COZAAR ) 25 MG tablet Take 1 tablet (25 mg total) by mouth daily.   Misc Natural Products (NEURIVA PO) Take 1 tablet by mouth daily.   niacin 500 MG tablet Take 500 mg by mouth at bedtime.   No facility-administered medications prior to visit.    Last CBC Lab Results  Component Value Date   WBC 8.8 09/05/2022   HGB 14.4 09/05/2022   HCT 44.2 09/05/2022   MCV 89 09/05/2022   MCH 29.1 09/05/2022   RDW 13.0 09/05/2022   PLT 377  09/05/2022   Last metabolic panel Lab Results  Component Value Date   GLUCOSE 86 10/02/2023   NA 143 10/02/2023   K 4.4 10/02/2023   CL 105 10/02/2023   CO2 27 10/02/2023   BUN 15 10/02/2023   CREATININE 0.85 10/02/2023   EGFR 74 10/02/2023   CALCIUM  9.4 10/02/2023   PROT 6.4 04/03/2023   ALBUMIN 4.3 04/03/2023   LABGLOB 2.1 04/03/2023   AGRATIO 2.0 04/03/2023   BILITOT 0.4 04/03/2023   ALKPHOS 73 04/03/2023   AST 22 04/03/2023   ALT 18 04/03/2023   ANIONGAP 7 09/02/2013   Lab Results  Component Value Date   COLORU yellow 11/14/2024   CLARITYU clear 11/14/2024   GLUCOSEUR Negative 11/14/2024   BILIRUBINUR negative 11/14/2024   KETONESU negative 11/14/2024   SPECGRAV 1.015 11/14/2024   RBCUR negative 11/14/2024   PHUR 6.0 11/14/2024   PROTEINUR Positive (A) 11/14/2024   UROBILINOGEN 0.2 11/14/2024   LEUKOCYTESUR Trace (A) 11/14/2024         Objective    BP (!) 154/82 (Cuff Size: Normal)   Pulse 83   Temp 97.9 F (36.6 C) (Oral)   Ht 5' 1 (1.549 m)   Wt 173 lb 3.2 oz (78.6 kg)   SpO2 100%   BMI 32.73 kg/m  BP Readings from Last 3 Encounters:  11/14/24 (!) 154/82  10/12/24 (!) 146/80  09/26/24 (!) 180/86   Wt Readings from Last 3 Encounters:  11/14/24 173 lb 3.2 oz (78.6 kg)  10/12/24 171 lb (77.6 kg)  09/26/24 165 lb (74.8 kg)      Physical Exam   Physical Exam VITALS: BP- 178/86 CHEST: No respiratory distress. CARDIOVASCULAR: Regular cardiac rhythm. BREAST: Breast tenderness. ABDOMEN: No abdominal distention. Suprapubic tenderness. No costovertebral angle tenderness.   Results for orders placed or performed in visit on 11/14/24  POCT Urinalysis Dipstick  Result Value Ref Range   Color, UA yellow    Clarity, UA clear    Glucose, UA Negative Negative   Bilirubin, UA negative    Ketones, UA negative    Spec Grav, UA 1.015 1.010 - 1.025   Blood, UA negative    pH, UA 6.0 5.0 - 8.0   Protein, UA Positive (A) Negative   Urobilinogen, UA  0.2 0.2 or 1.0 E.U./dL   Nitrite, UA positive (A)    Leukocytes, UA Trace (A) Negative   Appearance clear    Odor normal     Assessment & Plan     Assessment and Plan Assessment & Plan Acute cystitis with suprapubic pain Acute cystitis with suprapubic pain, confirmed by urinalysis showing positive nitrites, trace leukocytes, and protein. Symptoms include urinary discomfort and positive Azo test strips for bacteria. No allergies to Bactrim  reported. - Prescribed Bactrim  800-160mg  BID  for 7 days - Sent urine for culture and sensitivity - Will adjust treatment  based on culture results if necessary  Essential hypertension Chronic condition  Blood pressure readings elevated in office (178/86, 154/80) but well-controlled at home (142/80, 130/70). Stress and recent physical activity may contribute to elevated readings. - Continue monitoring blood pressure at home - Rechecked blood pressure in office  Gastroesophageal reflux disease with hiatal hernia Chronic gastroesophageal reflux disease with hiatal hernia, managed with Pepcid as needed. Concerns about esophageal damage due to long-standing condition. Previous discussions about surgical intervention were declined due to extensive preoperative requirements. - Continue Pepcid as needed - Scheduled endoscopy and colonoscopy for further evaluation      No follow-ups on file.        Rockie Agent, MD  [facility] [phone] (phone) [phone] (fax)  [facility]

## 2024-11-18 ENCOUNTER — Ambulatory Visit: Payer: Self-pay | Admitting: Family Medicine

## 2024-11-18 LAB — URINE CULTURE

## 2024-11-25 ENCOUNTER — Telehealth: Payer: Self-pay

## 2024-12-09 ENCOUNTER — Ambulatory Visit (INDEPENDENT_AMBULATORY_CARE_PROVIDER_SITE_OTHER): Admitting: Family Medicine

## 2024-12-09 ENCOUNTER — Encounter: Payer: Self-pay | Admitting: Family Medicine

## 2024-12-09 DIAGNOSIS — Z0001 Encounter for general adult medical examination with abnormal findings: Secondary | ICD-10-CM

## 2024-12-09 DIAGNOSIS — R3989 Other symptoms and signs involving the genitourinary system: Secondary | ICD-10-CM

## 2024-12-09 DIAGNOSIS — I10 Essential (primary) hypertension: Secondary | ICD-10-CM | POA: Diagnosis not present

## 2024-12-09 DIAGNOSIS — Z23 Encounter for immunization: Secondary | ICD-10-CM | POA: Diagnosis not present

## 2024-12-09 DIAGNOSIS — R109 Unspecified abdominal pain: Secondary | ICD-10-CM

## 2024-12-09 DIAGNOSIS — I2510 Atherosclerotic heart disease of native coronary artery without angina pectoris: Secondary | ICD-10-CM

## 2024-12-09 DIAGNOSIS — K219 Gastro-esophageal reflux disease without esophagitis: Secondary | ICD-10-CM

## 2024-12-09 DIAGNOSIS — N3000 Acute cystitis without hematuria: Secondary | ICD-10-CM | POA: Diagnosis not present

## 2024-12-09 DIAGNOSIS — Z130 Encounter for screening for diseases of the blood and blood-forming organs and certain disorders involving the immune mechanism: Secondary | ICD-10-CM

## 2024-12-09 DIAGNOSIS — Z131 Encounter for screening for diabetes mellitus: Secondary | ICD-10-CM

## 2024-12-09 DIAGNOSIS — Z1211 Encounter for screening for malignant neoplasm of colon: Secondary | ICD-10-CM

## 2024-12-09 DIAGNOSIS — R1024 Suprapubic pain: Secondary | ICD-10-CM

## 2024-12-09 DIAGNOSIS — Z1321 Encounter for screening for nutritional disorder: Secondary | ICD-10-CM

## 2024-12-09 DIAGNOSIS — E782 Mixed hyperlipidemia: Secondary | ICD-10-CM

## 2024-12-09 DIAGNOSIS — Z0000 Encounter for general adult medical examination without abnormal findings: Secondary | ICD-10-CM

## 2024-12-09 MED ORDER — CEPHALEXIN 500 MG PO CAPS: 500.0000 mg | ORAL_CAPSULE | Freq: Three times a day (TID) | ORAL | 0 refills | Status: AC

## 2024-12-09 NOTE — Patient Instructions (Signed)
 To keep you healthy, please keep in mind the following health maintenance items that you are due for:   Health Maintenance Due  Topic Date Due   Zoster Vaccines- Shingrix (1 of 2) Never done   DTaP/Tdap/Td (2 - Td or Tdap) 10/18/2023   Colonoscopy  12/06/2023   COVID-19 Vaccine (6 - 2025-26 season) 08/29/2024     Best Wishes,   Dr. Lang

## 2024-12-09 NOTE — Progress Notes (Unsigned)
 Complete physical exam   Patient: Teresa Torres   DOB: 1951-11-21   73 y.o. Female  MRN: [id number] Visit Date: 12/09/2024  Today's healthcare provider: Rockie Agent, MD   Chief Complaint  Patient presents with   Annual Exam    Last completed 04/23/23 Diet -  well balanced Exercise - walking at work daily Feeling - fairly well Sleeping - fairly well Concerns -  recurrent uti   Urinary Tract Infection    Patient reports being treated about 2-3 weeks ago but still having pressure, frequency and pain when urinating. She reports as long as she is drinking tons of water symptoms are not present but as soon as she stops they return.   Care Management    Colonoscopy - scheduled in January Tetanus and Zoster Vaccines declined   Subjective    Teresa Torres is a 73 y.o. female who presents today for a complete physical exam.    She does have additional problems to discuss today.   Discussed the use of AI scribe software for clinical note transcription with the patient, who gave verbal consent to proceed.  History of Present Illness Teresa Torres is a 73 year old female with bladder prolapse who presents for an annual physical exam and continued suprapubic pressure.  She has been experiencing persistent suprapubic pressure despite completing a course of Bactrim  800-160 mg twice daily for seven days for acute cystitis, which ended on November 17th. The symptoms have persisted, with some days being particularly severe, such as this past Tuesday when urination was very painful and accompanied by significant back pain. Drinking water sometimes temporarily alleviates the symptoms.  A recent urinalysis showed negative nitrites, moderate leukocytes, and moderate blood. She has a history of similar symptoms two years ago, which were thought to be related to bladder prolapse. She has been taking Azo Complete daily since then, which she feels helps with symptoms.  She describes an  episode four to five months ago where she experienced a sensation of looseness in her right side, which later developed into pain that was intermittent and sometimes severe. This pain has since resolved, and she is unsure of the cause, speculating it might have been a hernia or a pulled muscle.  Her exercise routine includes walking frequently throughout a large building and performing physical tasks at home, as her husband is unable to assist due to mobility issues. She is trying to cook more at home to maintain a balanced diet.  She uses estrogen cream three times a week, which she feels does not alleviate her current symptoms.     Past Medical History:  Diagnosis Date   Allergy 1980   See chart   Arthritis    of spine   Basal cell carcinoma 04/23/2022   Right low back above waistline, EDC   Dysplastic nevus 09/06/2024   left superior medial buttock/sacral,mod atypia   GERD (gastroesophageal reflux disease) 1986   See chart   Hiatal hernia    Hyperlipidemia    Hypertension 2022   See chart   Past Surgical History:  Procedure Laterality Date   ABDOMINAL HYSTERECTOMY  1984   Partial   APPENDECTOMY     BREAST BIOPSY Right 1972   EXCISIONAL - NEG   CHOLECYSTECTOMY  2007-2008   KNEE SURGERY  1980   TONSILLECTOMY  1961   Social History   Socioeconomic History   Marital status: Married    Spouse name: Lorayne Getchell   Number  of children: Not on file   Years of education: Not on file   Highest education level: GED or equivalent  Occupational History   Not on file  Tobacco Use   Smoking status: Former   Smokeless tobacco: Never  Vaping Use   Vaping status: Never Used  Substance and Sexual Activity   Alcohol use: Yes    Alcohol/week: 6.0 standard drinks of alcohol    Types: 6 Glasses of wine per week   Drug use: Never   Sexual activity: Not Currently    Partners: Male  Other Topics Concern   Not on file  Social History Narrative   Not on file   Social Drivers of  Health   Tobacco Use: Medium Risk (12/09/2024)   Patient History    Smoking Tobacco Use: Former    Smokeless Tobacco Use: Never    Passive Exposure: Not on file  Financial Resource Strain: Low Risk (10/11/2024)   Overall Financial Resource Strain (CARDIA)    Difficulty of Paying Living Expenses: Not hard at all  Food Insecurity: No Food Insecurity (10/11/2024)   Epic    Worried About Programme Researcher, Broadcasting/film/video in the Last Year: Never true    Ran Out of Food in the Last Year: Never true  Transportation Needs: No Transportation Needs (10/11/2024)   Epic    Lack of Transportation (Medical): No    Lack of Transportation (Non-Medical): No  Physical Activity: Insufficiently Active (10/11/2024)   Exercise Vital Sign    Days of Exercise per Week: 3 days    Minutes of Exercise per Session: 30 min  Stress: No Stress Concern Present (10/11/2024)   [employer] of Occupational Health - Occupational Stress Questionnaire    Feeling of Stress: Only a little  Social Connections: Socially Integrated (10/11/2024)   Social Connection and Isolation Panel    Frequency of Communication with Friends and Family: More than three times a week    Frequency of Social Gatherings with Friends and Family: Once a week    Attends Religious Services: More than 4 times per year    Active Member of Clubs or Organizations: Yes    Attends Banker Meetings: More than 4 times per year    Marital Status: Married  Catering Manager Violence: Not on file  Depression (PHQ2-9): Low Risk (04/06/2024)   Depression (PHQ2-9)    PHQ-2 Score: 0  Alcohol Screen: Low Risk (10/11/2024)   Alcohol Screen    Last Alcohol Screening Score (AUDIT): 3  Housing: Low Risk (10/11/2024)   Epic    Unable to Pay for Housing in the Last Year: No    Number of Times Moved in the Last Year: 0    Homeless in the Last Year: No  Utilities: Not on file  Health Literacy: Not on file   Family Status  Relation Name Status   Mother Maceo Mt Deceased at age 83       Heart Disease   Father  Deceased at age 21       Died from a motor vehicle   Brother  Deceased       died from a motor vehicle accident   Son  Alive       strokes   [employer]  (Not Specified)   [employer] (Not Specified)   Son Maricela Jerre Evetta Mickey Alive  No partnership data on file   Family History  Problem Relation Age of Onset   Hypertension Mother  Heart disease Mother    Arthritis Mother    Miscarriages / Stillbirths Mother    Breast cancer Paternal Grandmother    Diabetes Maternal Grandmother    Stroke Son    Allergies[1]   Medications: Show/hide medication list[2]  Review of Systems  Last CBC Lab Results  Component Value Date   WBC 8.8 09/05/2022   HGB 14.4 09/05/2022   HCT 44.2 09/05/2022   MCV 89 09/05/2022   MCH 29.1 09/05/2022   RDW 13.0 09/05/2022   PLT 377 09/05/2022   Last metabolic panel Lab Results  Component Value Date   GLUCOSE 86 10/02/2023   NA 143 10/02/2023   K 4.4 10/02/2023   CL 105 10/02/2023   CO2 27 10/02/2023   BUN 15 10/02/2023   CREATININE 0.85 10/02/2023   EGFR 74 10/02/2023   CALCIUM  9.4 10/02/2023   PROT 6.4 04/03/2023   ALBUMIN 4.3 04/03/2023   LABGLOB 2.1 04/03/2023   AGRATIO 2.0 04/03/2023   BILITOT 0.4 04/03/2023   ALKPHOS 73 04/03/2023   AST 22 04/03/2023   ALT 18 04/03/2023   ANIONGAP 7 09/02/2013   Last lipids Lab Results  Component Value Date   CHOL 183 04/03/2023   HDL 75 04/03/2023   LDLCALC 94 04/03/2023   TRIG 79 04/03/2023   CHOLHDL 2.4 04/03/2023   Last hemoglobin A1c No results found for: HGBA1C Last thyroid functions Lab Results  Component Value Date   TSH 2.140 09/05/2022   FREET4 1.36 09/05/2022   Last vitamin D  No results found for: 25OHVITD2, 25OHVITD3, VD25OH Last vitamin B12 and Folate No results found for: VITAMINB12, FOLATE   {See past labs  Heme  Chem  Endocrine  Serology  Results Review (optional):1}  Objective     There were no vitals taken for this visit. BP Readings from Last 3 Encounters:  11/14/24 (!) 154/82  10/12/24 (!) 146/80  09/26/24 (!) 180/86   Wt Readings from Last 3 Encounters:  11/14/24 173 lb 3.2 oz (78.6 kg)  10/12/24 171 lb (77.6 kg)  09/26/24 165 lb (74.8 kg)    {See vitals history (optional):1}    Physical Exam Vitals reviewed.  Constitutional:      General: She is not in acute distress.    Appearance: Normal appearance. She is not ill-appearing, toxic-appearing or diaphoretic.  HENT:     Head: Normocephalic and atraumatic.     Right Ear: Tympanic membrane and external ear normal. There is no impacted cerumen.     Left Ear: Tympanic membrane and external ear normal. There is no impacted cerumen.     Nose: Nose normal.     Mouth/Throat:     Pharynx: Oropharynx is clear.  Eyes:     General: No scleral icterus.    Extraocular Movements: Extraocular movements intact.     Conjunctiva/sclera: Conjunctivae normal.     Pupils: Pupils are equal, round, and reactive to light.  Cardiovascular:     Rate and Rhythm: Normal rate and regular rhythm.     Pulses: Normal pulses.     Heart sounds: Normal heart sounds. No murmur heard.    No friction rub. No gallop.  Pulmonary:     Effort: Pulmonary effort is normal. No respiratory distress.     Breath sounds: Normal breath sounds. No wheezing, rhonchi or rales.  Abdominal:     General: Bowel sounds are normal. There is no distension.     Palpations: Abdomen is soft. There is no mass.     Tenderness:  There is no abdominal tenderness. There is no guarding.  Musculoskeletal:        General: No deformity.     Cervical back: Normal range of motion and neck supple.     Right lower leg: No edema.     Left lower leg: No edema.  Lymphadenopathy:     Cervical: No cervical adenopathy.  Skin:    General: Skin is warm.     Capillary Refill: Capillary refill takes less than 2 seconds.     Findings: No erythema or rash.  Neurological:      General: No focal deficit present.     Mental Status: She is alert and oriented to person, place, and time.     Cranial Nerves: Cranial nerves 2-12 are intact. No cranial nerve deficit or facial asymmetry.     Motor: Motor function is intact. No weakness.     Gait: Gait normal.  Psychiatric:        Mood and Affect: Mood normal.        Behavior: Behavior normal.       Last depression screening scores    04/06/2024   11:16 AM 04/03/2023    8:52 AM 01/09/2023    9:44 AM  PHQ 2/9 Scores  PHQ - 2 Score 0 0 0  PHQ- 9 Score  1  2      Data saved with a previous flowsheet row definition    Last fall risk screening    04/06/2024   11:16 AM  Fall Risk   Falls in the past year? 0  Number falls in past yr: 0  Injury with Fall? 0      Data saved with a previous flowsheet row definition    Last Audit-C alcohol use screening    10/11/2024    1:52 PM  Alcohol Use Disorder Test (AUDIT)  1. How often do you have a drink containing alcohol? 3   2. How many drinks containing alcohol do you have on a typical day when you are drinking? 0   3. How often do you have six or more drinks on one occasion? 0   AUDIT-C Score 3   4. How often during the last year have you found that you were not able to stop drinking once you had started? 0   5. How often during the last year have you failed to do what was normally expected from you because of drinking? 0   6. How often during the last year have you needed a first drink in the morning to get yourself going after a heavy drinking session? 0   7. How often during the last year have you had a feeling of guilt of remorse after drinking? 0   8. How often during the last year have you been unable to remember what happened the night before because you had been drinking? 0   9. Have you or someone else been injured as a result of your drinking? 0   10. Has a relative or friend or a doctor or another health worker been concerned about your drinking or  suggested you cut down? 0   Alcohol Use Disorder Identification Test Final Score (AUDIT) 3      Manually entered by patient   A score of 3 or more in women, and 4 or more in men indicates increased risk for alcohol abuse, EXCEPT if all of the points are from question 1   No results found for any visits on  12/09/24.  Assessment & Plan    Routine Health Maintenance and Physical Exam  Immunization History  Administered Date(s) Administered   Fluad Quad(high Dose 65+) 09/14/2020, 10/18/2021, 09/05/2022   Fluad Trivalent(High Dose 65+) 10/02/2023   Fluzone Influenza virus vaccine,trivalent (IIV3), split virus 11/03/2017, 10/07/2019   INFLUENZA, HIGH DOSE SEASONAL PF 10/17/2013   Influenza,inj,Quad PF,6+ Mos 10/30/2016   Influenza-Unspecified 10/31/2017   PFIZER(Purple Top)SARS-COV-2 Vaccination 02/24/2020, 03/21/2020, 11/16/2020   PNEUMOCOCCAL CONJUGATE-20 03/07/2022, 05/06/2024   Pfizer Covid-19 Vaccine Bivalent Booster 66yrs & up 10/24/2021   Pfizer(Comirnaty)Fall Seasonal Vaccine 12 years and older 10/16/2022   Tdap 10/17/2013    Health Maintenance  Topic Date Due   Zoster Vaccines- Shingrix (1 of 2) Never done   DTaP/Tdap/Td (2 - Td or Tdap) 10/18/2023   Colonoscopy  12/06/2023   COVID-19 Vaccine (6 - 2025-26 season) 08/29/2024   Influenza Vaccine  03/28/2025 (Originally 07/29/2024)   Mammogram  10/07/2025   Pneumococcal Vaccine: 50+ Years  Completed   Bone Density Scan  Completed   Hepatitis C Screening  Completed   Meningococcal B Vaccine  Aged Out    Problem List Items Addressed This Visit     Acid reflux   Annual physical exam - Primary   Arteriosclerosis of coronary artery   HYPERLIPIDEMIA, MIXED   Primary hypertension   Other Visit Diagnoses       Screening for colon cancer         Acute cystitis without hematuria         Suprapubic pressure         Suspected UTI         Encounter for vitamin deficiency screening         Screening, anemia, deficiency, iron          Screening for diabetes mellitus           Assessment and Plan Assessment & Plan Recurrent urinary tract infection with suprapubic pain and hematuria Persistent suprapubic pain and hematuria despite completion of Bactrim  for acute cystitis. Urinalysis shows moderate leukocytes and blood, negative for nitrites. Differential includes recurrent UTI and non-infectious causes such as cystitis or bladder irritation. Previous similar symptoms resolved without intervention, but current symptoms are more severe with hematuria. - Ordered urine analysis, urine microscopy, and urine culture - Prescribed Keflex  500 mg three times a day for 7 days - Referred to urology for further evaluation, including potential cystoscopy - Advised follow-up with urogynecology if symptoms persist  Primary hypertension  Mixed hyperlipidemia  Atherosclerotic heart disease of native coronary artery  Gastroesophageal reflux disease  Right lateral abdominal pain, resolved Intermittent right lateral abdominal pain resolved spontaneously. Differential includes musculoskeletal pain, hernia, or trapped gas. - Ordered right upper quadrant ultrasound to evaluate for liver enlargement  Bladder prolapse Previous evaluation by urogynecology. Symptoms previously managed with Azo and estrogen cream. Current symptoms may be related to bladder prolapse, but no surgical intervention desired. - Advised follow-up with urogynecology if symptoms persist  General Health Maintenance Colon cancer screening is due. Colonoscopy is scheduled next month, but fecal occult test is offered as an interim measure. - Complete fecal occult test as interim colon cancer screening - Schedule colonoscopy as planned       No follow-ups on file.       Rockie Agent, MD  [facility] [phone] (phone) [phone] (fax)  [facility]     [1]  Allergies Allergen Reactions    Bee Venom Anaphylaxis   Compazine [  Prochlorperazine Edisylate] Anaphylaxis   Fish Allergy Anaphylaxis   Shellfish Allergy Anaphylaxis   Lisinopril  Cough   Aspirin Nausea And Vomiting   Nitrofurantoin  Nausea And Vomiting  [2]  Outpatient Medications Prior to Visit  Medication Sig   atorvastatin  (LIPITOR) 20 MG tablet TAKE 1 TABLET BY MOUTH EVERY DAY   dexlansoprazole  (DEXILANT ) 60 MG capsule Take 1 capsule (60 mg total) by mouth daily.   EPINEPHrine  0.3 mg/0.3 mL IJ SOAJ injection Inject into the muscle.   estradiol  (ESTRACE ) 0.1 MG/GM vaginal cream Place 0.5 g vaginally 2 (two) times a week. Place 0.5g nightly for two weeks then twice a week after   famotidine  (PEPCID ) 40 MG tablet TAKE 1 TABLET BY MOUTH EVERY DAY   hydrocortisone  2.5 % cream Apply to aa's face and ears QD on Monday, Wednesday, and Friday   ketoconazole  (NIZORAL ) 2 % cream Apply to aa's ears and face QD on Tuesday, Thursday, and Saturday.   Lactobacillus (AZO COMPLETE FEMININE BALANCE PO) Take 1 tablet by mouth daily.   loratadine (CLARITIN) 10 MG tablet Take 10 mg by mouth daily. Every morning 5am   losartan  (COZAAR ) 25 MG tablet Take 1 tablet (25 mg total) by mouth daily.   Misc Natural Products (NEURIVA PO) Take 1 tablet by mouth daily.   niacin 500 MG tablet Take 500 mg by mouth at bedtime.   No facility-administered medications prior to visit.

## 2024-12-10 ENCOUNTER — Ambulatory Visit: Payer: Self-pay | Admitting: Family Medicine

## 2024-12-10 LAB — URINALYSIS, MICROSCOPIC ONLY
Casts: NONE SEEN /LPF
Epithelial Cells (non renal): >10 /HPF — AB (ref 0–10)
WBC, UA: >30 /HPF — AB (ref 0–5)

## 2024-12-10 LAB — CBC
Hematocrit: 44.2 % (ref 34.0–46.6)
Hemoglobin: 14.6 g/dL (ref 11.1–15.9)
MCH: 29.8 pg (ref 26.6–33.0)
MCHC: 33 g/dL (ref 31.5–35.7)
MCV: 90 fL (ref 79–97)
Platelets: 390 x10E3/uL (ref 150–450)
RBC: 4.9 x10E6/uL (ref 3.77–5.28)
RDW: 12.8 % (ref 11.7–15.4)
WBC: 7.5 x10E3/uL (ref 3.4–10.8)

## 2024-12-10 LAB — VITAMIN B12: Vitamin B-12: 424 pg/mL (ref 232–1245)

## 2024-12-10 LAB — HEMOGLOBIN A1C
Est. average glucose Bld gHb Est-mCnc: 105 mg/dL
Hgb A1c MFr Bld: 5.3 % (ref 4.8–5.6)

## 2024-12-10 LAB — CMP14+EGFR
ALT: 20 IU/L (ref 0–32)
AST: 17 IU/L (ref 0–40)
Albumin: 4.6 g/dL (ref 3.8–4.8)
Alkaline Phosphatase: 71 IU/L (ref 49–135)
BUN/Creatinine Ratio: 14 (ref 12–28)
BUN: 14 mg/dL (ref 8–27)
Bilirubin Total: 0.8 mg/dL (ref 0.0–1.2)
CO2: 25 mmol/L (ref 20–29)
Calcium: 9.8 mg/dL (ref 8.7–10.3)
Chloride: 104 mmol/L (ref 96–106)
Creatinine, Ser: 0.97 mg/dL (ref 0.57–1.00)
Globulin, Total: 1.8 g/dL (ref 1.5–4.5)
Glucose: 91 mg/dL (ref 70–99)
Potassium: 4 mmol/L (ref 3.5–5.2)
Sodium: 144 mmol/L (ref 134–144)
Total Protein: 6.4 g/dL (ref 6.0–8.5)
eGFR: 62 mL/min/1.73 (ref >59)

## 2024-12-10 LAB — LIPID PANEL
Chol/HDL Ratio: 2.9 ratio (ref 0.0–4.4)
Cholesterol, Total: 194 mg/dL (ref 100–199)
HDL: 66 mg/dL (ref >39)
LDL Chol Calc (NIH): 108 mg/dL — ABNORMAL HIGH (ref 0–99)
Triglycerides: 112 mg/dL (ref 0–149)
VLDL Cholesterol Cal: 20 mg/dL (ref 5–40)

## 2024-12-10 LAB — VITAMIN D 25 HYDROXY (VIT D DEFICIENCY, FRACTURES): Vit D, 25-Hydroxy: 21.6 ng/mL — ABNORMAL LOW (ref 30.0–100.0)

## 2024-12-11 LAB — URINE CULTURE

## 2024-12-12 ENCOUNTER — Telehealth: Payer: Self-pay

## 2024-12-12 NOTE — Telephone Encounter (Signed)
 LVM for patient to return call so we can schedule Colonoscopy and EGD.

## 2024-12-13 ENCOUNTER — Telehealth: Payer: Self-pay

## 2024-12-13 ENCOUNTER — Other Ambulatory Visit: Payer: Self-pay

## 2024-12-13 NOTE — Telephone Encounter (Signed)
 Tried to call patient  to schedule colonoscopy and egd -when she was in the office she did not want to schedule at that time.  I mailed letter asking patient to return call to office.

## 2024-12-15 NOTE — Telephone Encounter (Signed)
 Spoke with patient- she is getting new insurance in [place] sure who it will be- and she stated she would call and schedule EGD/colonoscopy once she has the cards.

## 2024-12-23 ENCOUNTER — Ambulatory Visit
Admission: RE | Admit: 2024-12-23 | Discharge: 2024-12-23 | Disposition: A | Discharge status: Home / Self Care | Source: Ambulatory Visit | Attending: Family Medicine | Admitting: Family Medicine

## 2024-12-23 DIAGNOSIS — R109 Unspecified abdominal pain: Secondary | ICD-10-CM | POA: Insufficient documentation

## 2024-12-30 ENCOUNTER — Other Ambulatory Visit: Payer: Self-pay

## 2024-12-30 DIAGNOSIS — Z1211 Encounter for screening for malignant neoplasm of colon: Secondary | ICD-10-CM

## 2024-12-30 LAB — IFOBT (OCCULT BLOOD): IFOBT: NEGATIVE

## 2025-01-04 NOTE — Progress Notes (Signed)
 Last read by [name] Patient at 1:22PM on 01/03/2025.

## 2025-01-09 ENCOUNTER — Encounter: Payer: Self-pay | Admitting: *Deleted

## 2025-04-28 ENCOUNTER — Ambulatory Visit: Admitting: Family Medicine

## 2025-09-06 ENCOUNTER — Ambulatory Visit: Admitting: Dermatology
